# Patient Record
Sex: Male | Born: 1950 | Race: White | Hispanic: No | Marital: Married | Smoking: Never smoker
Health system: Southern US, Community
[De-identification: ages and names within clinical notes are randomized; demographics above are authoritative.]

## PROBLEM LIST (undated history)

## (undated) DIAGNOSIS — N400 Enlarged prostate without lower urinary tract symptoms: Secondary | ICD-10-CM

## (undated) DIAGNOSIS — I10 Essential (primary) hypertension: Secondary | ICD-10-CM

## (undated) DIAGNOSIS — R12 Heartburn: Secondary | ICD-10-CM

## (undated) DIAGNOSIS — E119 Type 2 diabetes mellitus without complications: Secondary | ICD-10-CM

## (undated) DIAGNOSIS — N200 Calculus of kidney: Secondary | ICD-10-CM

## (undated) DIAGNOSIS — T753XXA Motion sickness, initial encounter: Secondary | ICD-10-CM

## (undated) DIAGNOSIS — E785 Hyperlipidemia, unspecified: Secondary | ICD-10-CM

## (undated) DIAGNOSIS — C801 Malignant (primary) neoplasm, unspecified: Secondary | ICD-10-CM

## (undated) DIAGNOSIS — I639 Cerebral infarction, unspecified: Secondary | ICD-10-CM

## (undated) HISTORY — DX: Heartburn: R12

## (undated) HISTORY — DX: Cerebral infarction, unspecified: I63.9

## (undated) HISTORY — DX: Calculus of kidney: N20.0

## (undated) HISTORY — PX: UMBILICAL HERNIA REPAIR: SHX196

---

## 2004-09-10 ENCOUNTER — Ambulatory Visit: Payer: Self-pay | Admitting: Gastroenterology

## 2013-12-26 ENCOUNTER — Emergency Department: Payer: Self-pay | Admitting: Emergency Medicine

## 2014-10-14 ENCOUNTER — Ambulatory Visit: Payer: BLUE CROSS/BLUE SHIELD | Admitting: Anesthesiology

## 2014-10-14 ENCOUNTER — Ambulatory Visit
Admission: RE | Admit: 2014-10-14 | Discharge: 2014-10-14 | Disposition: A | Discharge status: Home / Self Care | Payer: BLUE CROSS/BLUE SHIELD | Source: Ambulatory Visit | Attending: Gastroenterology | Admitting: Gastroenterology

## 2014-10-14 ENCOUNTER — Encounter: Payer: Self-pay | Admitting: *Deleted

## 2014-10-14 ENCOUNTER — Encounter
Admission: RE | Disposition: A | Discharge status: Home / Self Care | Payer: Self-pay | Source: Ambulatory Visit | Attending: Gastroenterology

## 2014-10-14 DIAGNOSIS — Z85828 Personal history of other malignant neoplasm of skin: Secondary | ICD-10-CM | POA: Diagnosis not present

## 2014-10-14 DIAGNOSIS — E119 Type 2 diabetes mellitus without complications: Secondary | ICD-10-CM | POA: Diagnosis not present

## 2014-10-14 DIAGNOSIS — Z7982 Long term (current) use of aspirin: Secondary | ICD-10-CM | POA: Insufficient documentation

## 2014-10-14 DIAGNOSIS — Z1211 Encounter for screening for malignant neoplasm of colon: Secondary | ICD-10-CM | POA: Diagnosis not present

## 2014-10-14 DIAGNOSIS — Z79899 Other long term (current) drug therapy: Secondary | ICD-10-CM | POA: Diagnosis not present

## 2014-10-14 HISTORY — PX: COLONOSCOPY: SHX5424

## 2014-10-14 HISTORY — DX: Type 2 diabetes mellitus without complications: E11.9

## 2014-10-14 HISTORY — DX: Malignant (primary) neoplasm, unspecified: C80.1

## 2014-10-14 LAB — GLUCOSE, CAPILLARY: Glucose-Capillary: 174 mg/dL — ABNORMAL HIGH (ref 65–99)

## 2014-10-14 SURGERY — COLONOSCOPY
Anesthesia: General

## 2014-10-14 MED ORDER — MIDAZOLAM HCL 2 MG/2ML IJ SOLN
INTRAMUSCULAR | Status: DC | PRN
  Administered 2014-10-14: 2 mg via INTRAVENOUS

## 2014-10-14 MED ORDER — FENTANYL CITRATE (PF) 100 MCG/2ML IJ SOLN
INTRAMUSCULAR | Status: DC | PRN
  Administered 2014-10-14: 50 ug via INTRAVENOUS

## 2014-10-14 MED ORDER — PROPOFOL INFUSION 10 MG/ML OPTIME
INTRAVENOUS | Status: DC | PRN
  Administered 2014-10-14: 160 ug/kg/min via INTRAVENOUS

## 2014-10-14 MED ORDER — LIDOCAINE HCL (CARDIAC) 20 MG/ML IV SOLN
INTRAVENOUS | Status: DC | PRN
  Administered 2014-10-14: 60 mg via INTRAVENOUS

## 2014-10-14 MED ORDER — SODIUM CHLORIDE 0.9 % IV SOLN: INTRAVENOUS | Status: DC

## 2014-10-14 MED ORDER — FENTANYL CITRATE (PF) 100 MCG/2ML IJ SOLN: 25.0000 ug | INTRAMUSCULAR | Status: DC | PRN

## 2014-10-14 MED ORDER — ONDANSETRON HCL 4 MG/2ML IJ SOLN: 4.0000 mg | Freq: Once | INTRAMUSCULAR | Status: DC | PRN

## 2014-10-14 MED ORDER — SODIUM CHLORIDE 0.9 % IV SOLN
INTRAVENOUS | Status: DC
  Administered 2014-10-14: 1000 mL via INTRAVENOUS

## 2014-10-14 NOTE — H&P (Signed)
    Primary Care Physician:  Dion Body, MD Primary Gastroenterologist:  Dr. Candace Cruise  Pre-Procedure History & Physical: HPI:  Kristopher Christensen is a 64 y.o. male is here for colonoscopy.  Past Medical History  Diagnosis Date  . Diabetes mellitus without complication   . Cancer     Basal cell cancer of lt.lower leg    History reviewed. No pertinent past surgical history.  Prior to Admission medications   Medication Sig Start Date End Date Taking? Authorizing Provider  aspirin 81 MG tablet Take 81 mg by mouth daily.   Yes Historical Provider, MD  gemfibrozil (LOPID) 600 MG tablet Take 600 mg by mouth 2 (two) times daily before a meal.   Yes Historical Provider, MD  lisinopril (PRINIVIL,ZESTRIL) 2.5 MG tablet Take 2.5 mg by mouth daily.   Yes Historical Provider, MD  lovastatin (MEVACOR) 20 MG tablet Take 20 mg by mouth at bedtime.   Yes Historical Provider, MD  metFORMIN (GLUCOPHAGE) 1000 MG tablet Take 1,000 mg by mouth 2 (two) times daily with a meal.   Yes Historical Provider, MD  polyethylene glycol powder (GLYCOLAX/MIRALAX) powder Take 1 Container by mouth once.  08/24/14  Yes Historical Provider, MD    Allergies as of 09/20/2014  . (Not on File)    History reviewed. No pertinent family history.  History   Social History  . Marital Status: Married    Spouse Name: N/A  . Number of Children: N/A  . Years of Education: N/A   Occupational History  . Not on file.   Social History Main Topics  . Smoking status: Never Smoker   . Smokeless tobacco: Never Used  . Alcohol Use: Yes  . Drug Use: No  . Sexual Activity: Not on file   Other Topics Concern  . Not on file   Social History Narrative  . No narrative on file    Review of Systems: See HPI, otherwise negative ROS  Physical Exam: BP 148/83 mmHg  Pulse 55  Temp(Src) 97 F (36.1 C) (Tympanic)  Resp 18  Ht 5\' 10"  (1.778 m)  Wt 85.276 kg (188 lb)  BMI 26.98 kg/m2  SpO2 99% General:   Alert,  pleasant and  cooperative in NAD Head:  Normocephalic and atraumatic. Neck:  Supple; no masses or thyromegaly. Lungs:  Clear throughout to auscultation.    Heart:  Regular rate and rhythm. Abdomen:  Soft, nontender and nondistended. Normal bowel sounds, without guarding, and without rebound.   Neurologic:  Alert and  oriented x4;  grossly normal neurologically.  Impression/Plan: Kristopher Christensen is here for colonoscopy to be performed for screening.  Risks, benefits, limitations, and alternatives regarding colonoscopy have been reviewed with the patient.  Questions have been answered.  All parties agreeable.   Kristopher Christensen, Lupita Dawn, MD  10/14/2014, 8:27 AM

## 2014-10-14 NOTE — Transfer of Care (Signed)
Immediate Anesthesia Transfer of Care Note  Patient: Kristopher Christensen  Procedure(s) Performed: Procedure(s): COLONOSCOPY (N/A)  Patient Location: PACU and Endoscopy Unit  Anesthesia Type:General  Level of Consciousness: sedated  Airway & Oxygen Therapy: Patient Spontanous Breathing and Patient connected to nasal cannula oxygen  Post-op Assessment: Report given to RN, Post -op Vital signs reviewed and stable and Patient moving all extremities X 4  Post vital signs: Reviewed and stable  Last Vitals:  Filed Vitals:   10/14/14 0808  BP: 148/83  Pulse: 55  Temp: 36.1 C  Resp: 18    Complications: No apparent anesthesia complications

## 2014-10-14 NOTE — Op Note (Signed)
[facility] Gastroenterology Patient Name: Kristopher Christensen Procedure Date: 10/14/2014 8:37 AM MRN: [id number] Account #: [id number] Date of Birth: 1950/07/25 Admit Type: Inpatient Age: 64 Room: [facility] ENDO ROOM 4 Gender: Male Note Status: Finalized Procedure:         Colonoscopy Indications:       Screening for colorectal malignant neoplasm Providers:         Lupita Dawn. Candace Cruise, MD Referring MD:      Dion Body (Referring MD) Medicines:         Monitored Anesthesia Care Complications:     No immediate complications. Procedure:         Pre-Anesthesia Assessment:                    - Prior to the procedure, a History and Physical was                     performed, and patient medications, allergies and                     sensitivities were reviewed. The patient's tolerance of                     previous anesthesia was reviewed.                    - The risks and benefits of the procedure and the sedation                     options and risks were discussed with the patient. All                     questions were answered and informed consent was obtained.                    - After reviewing the risks and benefits, the patient was                     deemed in satisfactory condition to undergo the procedure.                    After obtaining informed consent, the colonoscope was                     passed under direct vision. Throughout the procedure, the                     patient's blood pressure, pulse, and oxygen saturations                     were monitored continuously. The Colonoscope was                     introduced through the anus and advanced to the the cecum,                     identified by appendiceal orifice and ileocecal valve. The                     colonoscopy was performed without difficulty. The patient                     tolerated the procedure well. The quality of the bowel  preparation was good. Findings:      The colon  (entire examined portion) appeared normal. Impression:        - The entire examined colon is normal.                    - No specimens collected. Recommendation:    - Discharge patient to home.                    - Repeat colonoscopy in 10 years for surveillance.                    - The findings and recommendations were discussed with the                     patient. Procedure Code(s): --- Professional ---                    [phone], Colonoscopy, flexible; diagnostic, including                     collection of specimen(s) by brushing or washing, when                     performed (separate procedure) Diagnosis Code(s): --- Professional ---                    Z12.11, Encounter for screening for malignant neoplasm of                     colon CPT copyright 2014 American Medical Association. All rights reserved. The codes documented in this report are preliminary and upon coder review may  be revised to meet current compliance requirements. Hulen Luster, MD 10/14/2014 8:52:36 AM This report has been signed electronically. Number of Addenda: 0 Note Initiated On: 10/14/2014 8:37 AM Scope Withdrawal Time: 0 hours 6 minutes 59 seconds  Total Procedure Duration: 0 hours 9 minutes 13 seconds       [facility]

## 2014-10-14 NOTE — Anesthesia Preprocedure Evaluation (Signed)
Anesthesia Evaluation  Patient identified by MRN, date of birth, ID band Patient awake    Reviewed: Allergy & Precautions, NPO status , Patient's Chart, lab work & pertinent test results  Airway Mallampati: II  TM Distance: >3 FB Neck ROM: Full    Dental  (+) Caps Molar caps:   Pulmonary neg pulmonary ROS,  breath sounds clear to auscultation  Pulmonary exam normal       Cardiovascular hypertension, Pt. on medications Rhythm:Regular Rate:Normal  Increased lipids   Neuro/Psych negative neurological ROS  negative psych ROS   GI/Hepatic negative GI ROS, Neg liver ROS,   Endo/Other  diabetes, Well Controlled, Type 2  Renal/GU negative Renal ROS  negative genitourinary   Musculoskeletal negative musculoskeletal ROS (+)   Abdominal Normal abdominal exam  (+)   Peds negative pediatric ROS (+)  Hematology negative hematology ROS (+)   Anesthesia Other Findings   Reproductive/Obstetrics negative OB ROS                             Anesthesia Physical Anesthesia Plan  ASA: II  Anesthesia Plan: General   Post-op Pain Management:    Induction: Intravenous  Airway Management Planned: Nasal Cannula  Additional Equipment:   Intra-op Plan:   Post-operative Plan:   Informed Consent: I have reviewed the patients History and Physical, chart, labs and discussed the procedure including the risks, benefits and alternatives for the proposed anesthesia with the patient or authorized representative who has indicated his/her understanding and acceptance.     Plan Discussed with: CRNA and Surgeon  Anesthesia Plan Comments:         Anesthesia Quick Evaluation

## 2014-10-14 NOTE — Anesthesia Postprocedure Evaluation (Signed)
  Anesthesia Post-op Note  Patient: Kristopher Christensen  Procedure(s) Performed: Procedure(s): COLONOSCOPY (N/A)  Anesthesia type:General  Patient location: PACU  Post pain: Pain level controlled  Post assessment: Post-op Vital signs reviewed, Patient's Cardiovascular Status Stable, Respiratory Function Stable, Patent Airway and No signs of Nausea or vomiting  Post vital signs: Reviewed and stable  Last Vitals:  Filed Vitals:   10/14/14 0900  BP: 117/71  Pulse: 57  Temp: 35.9 C  Resp: 17    Level of consciousness: awake, alert  and patient cooperative  Complications: No apparent anesthesia complications

## 2014-10-17 ENCOUNTER — Encounter: Payer: Self-pay | Admitting: Gastroenterology

## 2015-08-07 DIAGNOSIS — Z7189 Other specified counseling: Secondary | ICD-10-CM | POA: Insufficient documentation

## 2015-08-07 DIAGNOSIS — Z7185 Encounter for immunization safety counseling: Secondary | ICD-10-CM | POA: Insufficient documentation

## 2016-04-08 ENCOUNTER — Encounter: Payer: Self-pay | Admitting: Urology

## 2016-04-08 ENCOUNTER — Telehealth: Payer: Self-pay | Admitting: Urology

## 2016-04-08 ENCOUNTER — Ambulatory Visit (INDEPENDENT_AMBULATORY_CARE_PROVIDER_SITE_OTHER): Payer: BLUE CROSS/BLUE SHIELD | Admitting: Urology

## 2016-04-08 VITALS — BP 173/81 | HR 69 | Ht 71.0 in | Wt 189.0 lb

## 2016-04-08 DIAGNOSIS — N529 Male erectile dysfunction, unspecified: Secondary | ICD-10-CM

## 2016-04-08 DIAGNOSIS — N132 Hydronephrosis with renal and ureteral calculous obstruction: Secondary | ICD-10-CM

## 2016-04-08 DIAGNOSIS — N201 Calculus of ureter: Secondary | ICD-10-CM

## 2016-04-08 DIAGNOSIS — Z125 Encounter for screening for malignant neoplasm of prostate: Secondary | ICD-10-CM | POA: Diagnosis not present

## 2016-04-08 DIAGNOSIS — N200 Calculus of kidney: Secondary | ICD-10-CM

## 2016-04-08 DIAGNOSIS — R399 Unspecified symptoms and signs involving the genitourinary system: Secondary | ICD-10-CM

## 2016-04-08 LAB — URINALYSIS, COMPLETE
Bilirubin, UA: NEGATIVE
Leukocytes, UA: NEGATIVE
NITRITE UA: NEGATIVE
Protein, UA: NEGATIVE
RBC, UA: NEGATIVE
Specific Gravity, UA: 1.025 (ref 1.005–1.030)
Urobilinogen, Ur: 0.2 mg/dL (ref 0.2–1.0)
pH, UA: 5.5 (ref 5.0–7.5)

## 2016-04-08 LAB — MICROSCOPIC EXAMINATION
BACTERIA UA: NONE SEEN
EPITHELIAL CELLS (NON RENAL): NONE SEEN /HPF (ref 0–10)
RBC MICROSCOPIC, UA: NONE SEEN /HPF (ref 0–?)

## 2016-04-08 MED ORDER — TAMSULOSIN HCL 0.4 MG PO CAPS: 0.4000 mg | ORAL_CAPSULE | Freq: Every day | ORAL | 0 refills | Status: DC

## 2016-04-08 NOTE — Telephone Encounter (Signed)
Please call [facility] to see if we can obtain a disc with his CT images and how long it would take for [place] to receive the disc.

## 2016-04-08 NOTE — Progress Notes (Signed)
04/08/2016 12:50 PM   Kristopher Christensen [birth year] [id number]  Referring provider: Dion Body, MD [facility] [place], [place] [address]  Chief Complaint  Patient presents with  . New Patient (Initial Visit)    Kidney stone referred by Dr. Richarda Overlie    HPI: Patient is a 65 year old Caucasian male who is referred by his PCP, Dr. Netty Starring, for nephrolithiasis.  He is also complaining of LUTS and ED.   Patient states that approximately 1 month ago he started to experience right lower quadrant pain that radiated to the right groin.  The pain started in the evening and lasted until he went to bed. When he awoke in the morning the pain was no longer present.  The pain then returned to weeks later while he was in [place] attending his son's wedding. The pain again began in the right lower quadrant and radiated to the right groin. Only this time, the pain did not improve when he laid down to sleep. It became more intense and unrelenting. It was then he sought treatment at [facility] ED.    A CT scan of the abdomen and pelvis performed without contrast at that facility noted an 8 mm stone at the right UVJ resulting in right-sided hydronephrosis.  I do not have the images available to me at this time.   He was given prescription for a narcotic pain medication, Zofran and tamsulosin and discharged home.    Since his discharge from the emergency room, he has not had any further right lower quadrant pain or groin pain. He has not had any episodes of gross hematuria over the last several weeks. He does not have a prior history of nephrolithiasis. He was given a strainer and instructed to strain his urine, but he has not passed a fragment.  He is not had any fevers, chills, nausea or vomiting.  His UA today was unremarkable.  Patient has been having symptoms of urinary frequency, urgency, nocturia and intermittency for the last several years.    He has also  been suffering from erectile dysfunction for the last 2 years.  He denies any pain or curvature with his erections. He also denies any nocturnal tumescence.   PMH: Past Medical History:  Diagnosis Date  . Cancer ([place])    Basal cell cancer of lt.lower leg  . Diabetes mellitus without complication ([place])   . Heartburn   . Kidney stone     Surgical History: Past Surgical History:  Procedure Laterality Date  . COLONOSCOPY N/A 10/14/2014   Procedure: COLONOSCOPY;  Surgeon: Hulen Luster, MD;  Location: [facility] ENDOSCOPY;  Service: Gastroenterology;  Laterality: N/A;  . UMBILICAL HERNIA REPAIR      Home Medications:    Medication List       Accurate as of 04/08/16 11:59 PM. Always use your most recent med list.          aspirin 81 MG tablet Take 81 mg by mouth daily.   gemfibrozil 600 MG tablet Commonly known as:  LOPID Take 600 mg by mouth 2 (two) times daily before a meal.   glimepiride 2 MG tablet Commonly known as:  AMARYL TAKE 1 TABLET DAILY WITH BREAKFAST   glyBURIDE 1.25 MG tablet Commonly known as:  DIABETA Take by mouth.   HYDROcodone-acetaminophen 10-325 MG tablet Commonly known as:  NORCO Take by mouth.   lisinopril 2.5 MG tablet Commonly known as:  PRINIVIL,ZESTRIL Take 2.5 mg by mouth daily.  lovastatin 20 MG tablet Commonly known as:  MEVACOR Take 20 mg by mouth at bedtime.   metFORMIN 1000 MG tablet Commonly known as:  GLUCOPHAGE Take 1,000 mg by mouth 2 (two) times daily with a meal.   MULTIVITAMIN ADULT PO Take by mouth.   ondansetron 4 MG disintegrating tablet Commonly known as:  ZOFRAN-ODT Place under the tongue.   polyethylene glycol powder powder Commonly known as:  GLYCOLAX/MIRALAX Take 1 Container by mouth once.   tamsulosin 0.4 MG Caps capsule Commonly known as:  FLOMAX Take 1 capsule (0.4 mg total) by mouth daily.       Allergies: No Known Allergies  Family History: Family History  Problem Relation Age of Onset  . Prostate  cancer Neg Hx   . Kidney disease Neg Hx     Social History:  reports that he has never smoked. He has never used smokeless tobacco. He reports that he drinks alcohol. He reports that he does not use drugs.  ROS: UROLOGY Frequent Urination?: Yes Hard to postpone urination?: Yes Burning/pain with urination?: No Get up at night to urinate?: Yes Leakage of urine?: No Urine stream starts and stops?: Yes Trouble starting stream?: No Do you have to strain to urinate?: No Blood in urine?: No Urinary tract infection?: No Sexually transmitted disease?: No Injury to kidneys or bladder?: No Painful intercourse?: No Weak stream?: No Erection problems?: Yes Penile pain?: No  Gastrointestinal Nausea?: No Vomiting?: No Indigestion/heartburn?: Yes Diarrhea?: No Constipation?: No  Constitutional Fever: No Night sweats?: Yes Weight loss?: No Fatigue?: Yes  Skin Skin rash/lesions?: No Itching?: No  Eyes Blurred vision?: No Double vision?: No  Ears/Nose/Throat Sore throat?: No Sinus problems?: No  Hematologic/Lymphatic Swollen glands?: No Easy bruising?: No  Cardiovascular Leg swelling?: No Chest pain?: No  Respiratory Cough?: No Shortness of breath?: No  Endocrine Excessive thirst?: Yes  Musculoskeletal Back pain?: No Joint pain?: No  Neurological Headaches?: No Dizziness?: No  Psychologic Depression?: No Anxiety?: No  Physical Exam: BP (!) 173/81   Pulse 69   Ht '5\' 11"'  (1.803 m)   Wt 189 lb (85.7 kg)   BMI 26.36 kg/m   Constitutional: Well nourished. Alert and oriented, No acute distress. HEENT: [place] AT, moist mucus membranes. Trachea midline, no masses. Cardiovascular: No clubbing, cyanosis, or edema. Respiratory: Normal respiratory effort, no increased work of breathing. GI: Abdomen is soft, non tender, non distended, no abdominal masses. Liver and spleen not palpable.  No hernias appreciated.  Stool sample for occult testing is not indicated.     GU: No CVA tenderness.  No bladder fullness or masses.   Skin: No rashes, bruises or suspicious lesions. Lymph: No cervical or inguinal adenopathy. Neurologic: Grossly intact, no focal deficits, moving all 4 extremities. Psychiatric: Normal mood and affect.  Laboratory Data: Urinalysis Unremarkable.  See EPIC.  Pertinent Imaging: IMPRESSION:   8 mm stone at the right UVJ resulting in mild right-sided hydronephrosis.  No evidence of diverticulitis, no evidence of appendicitis.  Signed by: Harrington Challenger MD 03/12/2016 10:29 AM  Result Narrative  CT ABDOMEN PELVIS WO CONTRAST 03/12/2016 9:58 AM  AGE:89 years GENDER:Male HISTORY:Pain   COMPARISON: None.  TECHNIQUE:5 mm helical images were obtained from the lung bases through the ischia.  DOSAGE:Total exam DLP: 638.1 mGy-cm.CTDIvol (max): 12.4 mGy.    Dose reduction was performed with automated exposure control, iterative reconstruction technique and/or adjustment of the mA and/or kV for patient size.  FINDINGS:   Lung bases: Heart size is normal. No  consolidation, pericardial or pleural effusion.  Hepatobiliary: No focal hepatic lesion.No biliary ductal dilatation.Gallbladder is unremarkable.  Spleen: The spleen is normal in size without focal abnormality.  Pancreas: No focal mass or ductal dilatation.  Adrenal glands: Normal in appearance.  Kidneys: There is extensive perinephric stranding surrounding the right kidney. There is an 8 mm stone at the UVJ, at least partially within the urinary bladder best appreciated on series 601 image 106. There is resultant mild right-sided hydronephrosis and hydroureter. There are multiple bilateral renal cysts.  GI tract: No evidence of distention or wall thickening.  Pelvic organs and bladder: Unremarkable.  Peritoneum/retroperitoneum: No free air or free fluid.  Lymph nodes: No lymphadenopathy.  Vessels:  Unremarkable.  Musculoskeletal: Mild scattered degenerative changes throughout the visualized lumbar spine..     Assessment & Plan:    1. Right UVJ stone  - Symptoms consistent with a right UVJ stone  - We have requested the images from [facility] in [place]  - Patient will continue MET therapy with tamsulosin 0.4 mg daily and strain the urine  - Patient advised that the mean passage rate for stones 6-8 mm in size is 22 days.  - We discussed ESWL vs URS/LL/ureteral stent placement as possible treatment for his stone, but without the actual images, I could not recommend the most appropriate treatment at this time  - If the images cannot be received in a timely manner from [facility], we will order a RUS and KUB as patient does not want to incur the expense of another CT  - Urinalysis, Complete  - CULTURE, URINE COMPREHENSIVE  - Advised to contact our office or seek treatment in the ED if becomes febrile or pain/ vomiting are difficult control in order to arrange for emergent/urgent intervention  2. Right hydronephrosis  - RUS will be obtained one month after he has underwent definitive treatment for his right UVJ stone  3. LUTS  - Will address once he has been cleared of stone  4. Erectile dysfunction  -  Will address once he has been cleared of stone  5. PSA screening  - I did discuss that the AUA panel feels that in men age 28 to 47 years, there was sufficient certainty that the benefits of screening could outweigh the harms   - He has not had a recent PSA   - We will begin screening once he has been cleared of stone   Return for waiting on disc from [facility].    These notes generated with voice recognition software. I apologize for typographical errors.  Zara Council, [facility] [address], [place], [place] [address] [phone]

## 2016-04-10 NOTE — Telephone Encounter (Signed)
Please contact the patient to see if he wants to wait on the disc or order the RUS and KUB.

## 2016-04-10 NOTE — Telephone Encounter (Signed)
Radiology has sent the disk in the mail. It should be here in a 1 or 2.

## 2016-04-12 NOTE — Telephone Encounter (Signed)
I did speak with the patient and he wants to wait for the CT disk to get here due to money issues.   Sharyn Lull

## 2016-04-14 LAB — CULTURE, URINE COMPREHENSIVE

## 2016-04-19 NOTE — Telephone Encounter (Signed)
LMOM

## 2016-04-19 NOTE — Telephone Encounter (Signed)
I have reviewed the CT scans.  His stone is almost in the bladder on the CT.  Has he passed anything?  He will need a RUS if he has passed a stone.  If he has not passed a stone, he will need a KUB.

## 2016-04-22 NOTE — Telephone Encounter (Signed)
Patient needs a KUB.

## 2016-04-22 NOTE — Telephone Encounter (Signed)
Spoke with pt in reference to CT results. Pt stated that he has not passed a stone yet. Reinforced with pt to drink plenty of fluids and take flomax. Pt voiced understanding.

## 2016-04-22 NOTE — Telephone Encounter (Signed)
LMOM

## 2016-04-23 ENCOUNTER — Ambulatory Visit
Admission: RE | Admit: 2016-04-23 | Discharge: 2016-04-23 | Disposition: A | Discharge status: Home / Self Care | Payer: BLUE CROSS/BLUE SHIELD | Source: Ambulatory Visit | Attending: Urology | Admitting: Urology

## 2016-04-23 ENCOUNTER — Telehealth: Payer: Self-pay | Admitting: Urology

## 2016-04-23 DIAGNOSIS — Z87442 Personal history of urinary calculi: Secondary | ICD-10-CM | POA: Insufficient documentation

## 2016-04-23 DIAGNOSIS — Z09 Encounter for follow-up examination after completed treatment for conditions other than malignant neoplasm: Secondary | ICD-10-CM | POA: Diagnosis present

## 2016-04-23 DIAGNOSIS — M47816 Spondylosis without myelopathy or radiculopathy, lumbar region: Secondary | ICD-10-CM | POA: Diagnosis not present

## 2016-04-23 DIAGNOSIS — N200 Calculus of kidney: Secondary | ICD-10-CM

## 2016-04-23 NOTE — Telephone Encounter (Signed)
LMOM- needs a KUB

## 2016-04-23 NOTE — Addendum Note (Signed)
Addended by: Toniann Fail C on: 04/23/2016 11:35 AM   Modules accepted: Orders

## 2016-04-23 NOTE — Telephone Encounter (Signed)
Pt called back and I read message.  He will go for KUB today.

## 2016-04-24 ENCOUNTER — Telehealth: Payer: Self-pay

## 2016-04-24 DIAGNOSIS — N200 Calculus of kidney: Secondary | ICD-10-CM

## 2016-04-24 NOTE — Telephone Encounter (Signed)
Spoke with pt in reference to KUB results and needing an RUS. Pt voiced understanding. RUS orders placed.

## 2016-04-24 NOTE — Telephone Encounter (Signed)
-----   Message from Nori Riis, PA-C sent at 04/23/2016  4:43 PM EST ----- Please notify the patient that the stone is no longer seen on x-ray.  We will need to do a RUS to confirm the swelling in the kidney has healed.

## 2016-04-29 ENCOUNTER — Telehealth: Payer: Self-pay | Admitting: Urology

## 2016-04-29 NOTE — Telephone Encounter (Signed)
Yes. He still needs to have a renal ultrasound. The reason for this is patient's kidneys can remain swollen even after the passage of a stone. This may be due to debris, blood or extended periods of dilation.  If the kidney remains swollen and it is not treated, he could lose the kidney.

## 2016-04-29 NOTE — Telephone Encounter (Signed)
Pt brought in kidney stone to be sent off.  Just F.Y.I.

## 2016-04-29 NOTE — Telephone Encounter (Signed)
Patient has passed his stone and is bringing it in to send out to be tested. He wants to know if he still needs to have the RUS? Please advise.   Thanks,  Sharyn Lull

## 2016-05-02 ENCOUNTER — Telehealth: Payer: Self-pay

## 2016-05-02 ENCOUNTER — Ambulatory Visit
Admission: RE | Admit: 2016-05-02 | Discharge: 2016-05-02 | Disposition: A | Discharge status: Home / Self Care | Payer: BLUE CROSS/BLUE SHIELD | Source: Ambulatory Visit | Attending: Urology | Admitting: Urology

## 2016-05-02 DIAGNOSIS — Z87442 Personal history of urinary calculi: Secondary | ICD-10-CM | POA: Diagnosis present

## 2016-05-02 DIAGNOSIS — N281 Cyst of kidney, acquired: Secondary | ICD-10-CM | POA: Diagnosis not present

## 2016-05-02 DIAGNOSIS — Z09 Encounter for follow-up examination after completed treatment for conditions other than malignant neoplasm: Secondary | ICD-10-CM | POA: Insufficient documentation

## 2016-05-02 DIAGNOSIS — N200 Calculus of kidney: Secondary | ICD-10-CM

## 2016-05-02 DIAGNOSIS — N401 Enlarged prostate with lower urinary tract symptoms: Secondary | ICD-10-CM

## 2016-05-02 NOTE — Telephone Encounter (Signed)
-----   Message from Nori Riis, PA-C sent at 05/02/2016  1:11 PM EST ----- Please let the patient know that his RUS was negative.  We still need to check his PSA, prostate and discuss his ED.  He can make an appointment at his convenience.

## 2016-05-02 NOTE — Telephone Encounter (Signed)
Spoke with pt in reference to RUS results and needing an OV. Pt voiced understanding. Pt was transferred to the front to make f/u appt.

## 2016-05-06 ENCOUNTER — Other Ambulatory Visit: Payer: Self-pay | Admitting: Urology

## 2016-05-09 ENCOUNTER — Other Ambulatory Visit: Payer: BLUE CROSS/BLUE SHIELD

## 2016-05-09 DIAGNOSIS — N401 Enlarged prostate with lower urinary tract symptoms: Secondary | ICD-10-CM

## 2016-05-10 LAB — PSA: Prostate Specific Ag, Serum: 0.9 ng/mL (ref 0.0–4.0)

## 2016-05-14 ENCOUNTER — Encounter: Payer: Self-pay | Admitting: Urology

## 2016-05-14 ENCOUNTER — Ambulatory Visit (INDEPENDENT_AMBULATORY_CARE_PROVIDER_SITE_OTHER): Payer: BLUE CROSS/BLUE SHIELD | Admitting: Urology

## 2016-05-14 VITALS — BP 138/74 | HR 54 | Ht 71.0 in | Wt 192.1 lb

## 2016-05-14 DIAGNOSIS — N529 Male erectile dysfunction, unspecified: Secondary | ICD-10-CM

## 2016-05-14 DIAGNOSIS — N132 Hydronephrosis with renal and ureteral calculous obstruction: Secondary | ICD-10-CM

## 2016-05-14 DIAGNOSIS — R351 Nocturia: Secondary | ICD-10-CM | POA: Diagnosis not present

## 2016-05-14 DIAGNOSIS — N201 Calculus of ureter: Secondary | ICD-10-CM

## 2016-05-14 DIAGNOSIS — N401 Enlarged prostate with lower urinary tract symptoms: Secondary | ICD-10-CM | POA: Diagnosis not present

## 2016-05-14 DIAGNOSIS — N138 Other obstructive and reflux uropathy: Secondary | ICD-10-CM

## 2016-05-14 LAB — BLADDER SCAN AMB NON-IMAGING: Scan Result: 60

## 2016-05-14 MED ORDER — SILDENAFIL CITRATE 100 MG PO TABS: 100.0000 mg | ORAL_TABLET | Freq: Every day | ORAL | 12 refills | Status: DC | PRN

## 2016-05-14 MED ORDER — TAMSULOSIN HCL 0.4 MG PO CAPS: 0.4000 mg | ORAL_CAPSULE | Freq: Every day | ORAL | 12 refills | Status: DC

## 2016-05-14 NOTE — Progress Notes (Signed)
05/14/2016 11:47 AM   Kristopher Christensen [birth year] [id number]  Referring provider: Dion Body, MD [place] [facility] [place], [place] [address]  Chief Complaint  Patient presents with  . Follow-up    1 month ureteral stone    HPI: Patient is a 65 year old Caucasian male who has a history of nephrolithiasis, ED and BPH with LUTS who presents today for further evaluation.  History of nephrolithiasis A CT scan of the abdomen and pelvis without contrast performed at [facility] ED in [place] in 04/2016 noted an 8 mm stone at the right UVJ resulting in right-sided hydronephrosis.  He has since passed that stone.  His stone composition was 85% calcium oxalate monohydrate, 12% calcium oxalate dihydrate and 3% calcium phosphate carbonate.  Follow up RUS performed on 05/02/2016 noted bilateral simple renal cysts.  No other abnormality seen.  Erectile dysfunction His SHIM score is 14, which is mild to moderate ED.   He has also been suffering from erectile dysfunction for the last 2 years.  He denies any pain or curvature with his erections. He also denies any nocturnal tumescence.   His major complaint is semi rigid erections.  His libido is preserved.   His risk factors for ED are age, BPH, DM, HTN, HLD, alcohol abuse and blood pressure medications.   He not has tried anything in the past.       [place] Name 05/14/16 1127         SHIM: Over the last 6 months:   How do you rate your confidence that you could get and keep an erection? Very Low     When you had erections with sexual stimulation, how often were your erections hard enough for penetration (entering your partner)? Most Times (much more than half the time)     During sexual intercourse, how often were you able to maintain your erection after you had penetrated (entered) your partner? Difficult     During sexual intercourse, how difficult was it to maintain your erection to completion of intercourse?  Difficult     When you attempted sexual intercourse, how often was it satisfactory for you? Difficult       SHIM Total Score   SHIM 14        Score: 1-7 Severe ED 8-11 Moderate ED 12-16 Mild-Moderate ED 17-21 Mild ED 22-25 No ED   BPH WITH LUTS His IPSS score today is 15, which is moderate lower urinary tract symptomatology. He is mostly dissatisfied with his quality life due to his urinary symptoms.   His PVR is 60 mL.  Patient has been having symptoms of urinary frequency, urgency, nocturia x 4 and intermittency for the last several years.  He denies any dysuria, hematuria or suprapubic pain.   He also denies any recent fevers, chills, nausea or vomiting.  He does not have a family history of PCa.      IPSS    Row Name 05/14/16 1100         International Prostate Symptom Score   How often have you had the sensation of not emptying your bladder? Less than 1 in 5     How often have you had to urinate less than every two hours? More than half the time     How often have you found you stopped and started again several times when you urinated? Less than half the time     How often have  you found it difficult to postpone urination? Less than half the time     How often have you had a weak urinary stream? Less than 1 in 5 times     How often have you had to strain to start urination? Less than 1 in 5 times     How many times did you typically get up at night to urinate? 4 Times     Total IPSS Score 15       Quality of Life due to urinary symptoms   If you were to spend the rest of your life with your urinary condition just the way it is now how would you feel about that? Mostly Disatisfied        Score:  1-7 Mild 8-19 Moderate 20-35 Severe  PMH: Past Medical History:  Diagnosis Date  . Cancer ([place])    Basal cell cancer of lt.lower leg  . Diabetes mellitus without complication ([place])   . Heartburn   . Kidney stone     Surgical History: Past Surgical History:    Procedure Laterality Date  . COLONOSCOPY N/A 10/14/2014   Procedure: COLONOSCOPY;  Surgeon: Hulen Luster, MD;  Location: [facility] ENDOSCOPY;  Service: Gastroenterology;  Laterality: N/A;  . UMBILICAL HERNIA REPAIR      Home Medications:    Medication List       Accurate as of 05/14/16 11:47 AM. Always use your most recent med list.          aspirin 81 MG tablet Take 81 mg by mouth daily.   gemfibrozil 600 MG tablet Commonly known as:  LOPID Take 600 mg by mouth 2 (two) times daily before a meal.   glimepiride 2 MG tablet Commonly known as:  AMARYL TAKE 1 TABLET DAILY WITH BREAKFAST   glyBURIDE 1.25 MG tablet Commonly known as:  DIABETA Take by mouth.   HYDROcodone-acetaminophen 10-325 MG tablet Commonly known as:  NORCO Take by mouth.   lisinopril 2.5 MG tablet Commonly known as:  PRINIVIL,ZESTRIL Take 2.5 mg by mouth daily.   lovastatin 20 MG tablet Commonly known as:  MEVACOR Take 20 mg by mouth at bedtime.   metFORMIN 1000 MG tablet Commonly known as:  GLUCOPHAGE Take 1,000 mg by mouth 2 (two) times daily with a meal.   MULTIVITAMIN ADULT PO Take by mouth.   ondansetron 4 MG disintegrating tablet Commonly known as:  ZOFRAN-ODT Place under the tongue.   polyethylene glycol powder powder Commonly known as:  GLYCOLAX/MIRALAX Take 1 Container by mouth once.   sildenafil 100 MG tablet Commonly known as:  VIAGRA Take 1 tablet (100 mg total) by mouth daily as needed for erectile dysfunction. Take two hours prior to intercourse on an empty stomach   tamsulosin 0.4 MG Caps capsule Commonly known as:  FLOMAX Take 1 capsule (0.4 mg total) by mouth daily.       Allergies: No Known Allergies  Family History: Family History  Problem Relation Age of Onset  . Prostate cancer Neg Hx   . Kidney disease Neg Hx   . Bladder Cancer Neg Hx     Social History:  reports that he has never smoked. He has never used smokeless tobacco. He reports that he drinks alcohol.  He reports that he does not use drugs.  ROS: UROLOGY Frequent Urination?: Yes Hard to postpone urination?: No Burning/pain with urination?: No Get up at night to urinate?: Yes Leakage of urine?: No Urine stream starts and stops?: No Trouble starting stream?:  No Do you have to strain to urinate?: No Blood in urine?: No Urinary tract infection?: No Sexually transmitted disease?: No Injury to kidneys or bladder?: No Painful intercourse?: No Weak stream?: No Erection problems?: Yes Penile pain?: No  Gastrointestinal Nausea?: No Vomiting?: No Indigestion/heartburn?: No Diarrhea?: No Constipation?: No  Constitutional Fever: No Night sweats?: No Weight loss?: No Fatigue?: No  Skin Skin rash/lesions?: No Itching?: No  Eyes Blurred vision?: No Double vision?: No  Ears/Nose/Throat Sore throat?: No Sinus problems?: No  Hematologic/Lymphatic Swollen glands?: No Easy bruising?: No  Cardiovascular Leg swelling?: No Chest pain?: No  Respiratory Cough?: No Shortness of breath?: No  Endocrine Excessive thirst?: No  Musculoskeletal Back pain?: No Joint pain?: No  Neurological Headaches?: No Dizziness?: No  Psychologic Depression?: No Anxiety?: No  Physical Exam: BP 138/74   Pulse (!) 54   Ht 5\' 11"  (1.803 m)   Wt 192 lb 1.6 oz (87.1 kg)   BMI 26.79 kg/m   Constitutional: Well nourished. Alert and oriented, No acute distress. HEENT: [place] AT, moist mucus membranes. Trachea midline, no masses. Cardiovascular: No clubbing, cyanosis, or edema. Respiratory: Normal respiratory effort, no increased work of breathing. GI: Abdomen is soft, non tender, non distended, no abdominal masses. Liver and spleen not palpable.  No hernias appreciated.  Stool sample for occult testing is not indicated.   GU: No CVA tenderness.  No bladder fullness or masses.  Patient with circumcised phallus.  Urethral meatus is patent.  No penile discharge. No penile lesions or rashes.  Scrotum without lesions, cysts, rashes and/or edema.  Testicles are located scrotally bilaterally. No masses are appreciated in the testicles. Left and right epididymis are normal. Rectal: Patient with  normal sphincter tone. Anus and perineum without scarring or rashes. No rectal masses are appreciated. Prostate is approximately 45 grams, no nodules are appreciated. Seminal vesicles are normal. Skin: No rashes, bruises or suspicious lesions. Lymph: No cervical or inguinal adenopathy. Neurologic: Grossly intact, no focal deficits, moving all 4 extremities. Psychiatric: Normal mood and affect.  Laboratory Data: PSA history  0.9 ng/mL on 05/09/2016  Assessment & Plan:    1. Right UVJ stone  - resolved  - encouraged patient to drink 2.5 L of water daily, reduce salt and protein in his diet  2. Right hydronephrosis  - resolved  3. Erectile dysfunction  - SHIM score is 14  - I explained to the patient that in order to achieve an erection it takes good functioning of the nervous system (parasympathetic, sympathetic, sensory and motor), good blood flow into the erectile tissue of the penis and a desire to have sex  - I explained that conditions like diabetes, hypertension, coronary artery disease, peripheral vascular disease, smoking, alcohol consumption, age, sleep apnea and BPH can diminish the ability to have an erection  - We discussed trying PDE5 inhibitor  - He would like to try Viagra, He is warned not to take the Viagra with medications that contain nitrates.  I also advised him of the side effects, such as: headache, flushing, dyspepsia, abnormal vision, nasal congestion, back pain, myalgia, nausea, dizziness, and rash.  - RTC in one month for repeat SHIM score   4. BPH with LUTS  - IPSS score is 15/4  - Continue conservative management, avoiding bladder irritants and timed voiding's  - Initiate alpha-blocker (tamsulosin 0.4 mg daily), discussed side effects  - RTC in one month for  IPSS   5. Nocturia  - I explained to the patient that  nocturia is often multi-factorial and difficult to treat.  Sleeping disorders, heart conditions, peripheral vascular disease, diabetes, an enlarged prostate for men, an urethral stricture causing bladder outlet obstruction and/or certain medications can contribute to nocturia.  - I have suggested that the patient avoid caffeine after noon and alcohol in the evening.  He or she may also benefit from fluid restrictions after 6:00 in the evening and voiding just prior to bedtime.  - I have explained that research studies have showed that over 84% of patients with sleep apnea reported frequent nighttime urination.   With sleep apnea, oxygen decreases, carbon dioxide increases, the blood become more acidic, the heart rate drops and blood vessels in the lung constrict.  The body is then alerted that something is very wrong. The sleeper must wake enough to reopen the airway. By this time, the heart is racing and experiences a false signal of fluid overload. The heart excretes a hormone-like protein that tells the body to get rid of sodium and water, resulting in nocturia.  -  I also informed the patient that a recent study noted that decreasing sodium intake to 2.3 grams daily, if they don't have issues with hyponatremia, can also reduce the number of nightly voids  - The patient may benefit from a discussion with his or her primary care physician to see if he or she has risk factors for sleep apnea or other sleep disturbances and obtaining a sleep study.   Return in about 1 month (around 06/14/2016) for IPSS and SHIM.    These notes generated with voice recognition software. I apologize for typographical errors.  Zara Council, [facility] [address], [place], [place] [address] [phone]

## 2016-06-17 ENCOUNTER — Ambulatory Visit: Payer: BLUE CROSS/BLUE SHIELD | Admitting: Urology

## 2016-06-19 ENCOUNTER — Ambulatory Visit: Payer: BLUE CROSS/BLUE SHIELD | Admitting: Urology

## 2016-07-03 ENCOUNTER — Encounter: Payer: Self-pay | Admitting: Urology

## 2016-07-03 ENCOUNTER — Ambulatory Visit (INDEPENDENT_AMBULATORY_CARE_PROVIDER_SITE_OTHER): Payer: BLUE CROSS/BLUE SHIELD | Admitting: Urology

## 2016-07-03 VITALS — BP 186/79 | HR 61 | Ht 71.0 in | Wt 195.8 lb

## 2016-07-03 DIAGNOSIS — N529 Male erectile dysfunction, unspecified: Secondary | ICD-10-CM

## 2016-07-03 DIAGNOSIS — N401 Enlarged prostate with lower urinary tract symptoms: Secondary | ICD-10-CM | POA: Diagnosis not present

## 2016-07-03 DIAGNOSIS — N400 Enlarged prostate without lower urinary tract symptoms: Secondary | ICD-10-CM

## 2016-07-03 DIAGNOSIS — N138 Other obstructive and reflux uropathy: Secondary | ICD-10-CM | POA: Diagnosis not present

## 2016-07-03 DIAGNOSIS — R351 Nocturia: Secondary | ICD-10-CM

## 2016-07-03 LAB — BLADDER SCAN AMB NON-IMAGING: SCAN RESULT: 21

## 2016-07-03 NOTE — Progress Notes (Signed)
07/03/2016 10:21 AM   Kristopher Christensen [birth year] [id number]  Referring provider: Dion Body, MD [place] [place], [place] [address]  Chief Complaint  Patient presents with  . Benign Prostatic Hypertrophy    1 month follow up  . Nocturia    HPI: Patient is a 66 year old Caucasian male who has a history of nephrolithiasis, ED, nocturia and BPH with LUTS who presents today for a month follow up.  History of nephrolithiasis A CT scan of the abdomen and pelvis without contrast performed at [facility] ED in [place] in 04/2016 noted an 8 mm stone at the right UVJ resulting in right-sided hydronephrosis.  He has since passed that stone.  His stone composition was 85% calcium oxalate monohydrate, 12% calcium oxalate dihydrate and 3% calcium phosphate carbonate.  Follow up RUS performed on 05/02/2016 noted bilateral simple renal cysts.  No other abnormality seen.  Erectile dysfunction His SHIM score is 16, which is mild to moderate ED.   His previous SHIM score was 14.  He has also been suffering from erectile dysfunction for the last 2 years.  He denies any pain or curvature with his erections. He also denies any nocturnal tumescence.   His major complaint is semi rigid erections.  His libido is preserved.   His risk factors for ED are age, BPH, DM, HTN, HLD, alcohol abuse and blood pressure medications.   He was given a trial of Viagra at his visit last month.       SHIM    Row Name 05/14/16 1127 07/03/16 1014       SHIM: Over the last 6 months:   How do you rate your confidence that you could get and keep an erection? Very Low Low    When you had erections with sexual stimulation, how often were your erections hard enough for penetration (entering your partner)? Most Times (much more than half the time) Sometimes (about half the time)    During sexual intercourse, how often were you able to maintain your erection after you had penetrated (entered) your  partner? Difficult Difficult    During sexual intercourse, how difficult was it to maintain your erection to completion of intercourse? Difficult Slightly Difficult    When you attempted sexual intercourse, how often was it satisfactory for you? Difficult Slightly Difficult      SHIM Total Score   SHIM 14 16       Score: 1-7 Severe ED 8-11 Moderate ED 12-16 Mild-Moderate ED 17-21 Mild ED 22-25 No ED   BPH WITH LUTS His IPSS score today is 14 , which is moderate lower urinary tract symptomatology. He is mostly dissatisfied with his quality life due to his urinary symptoms.   His PVR is 21 mL.  His previous I PSS score was 15/4.  His previous PVR was 60  mL.  Patient complains of  nocturia x 4.  He denies any dysuria, hematuria or suprapubic pain.   He also denies any recent fevers, chills, nausea or vomiting.  He does not have a family history of PCa.  He was started on tamsulosin 0.4 mg daily at his visit one month ago.        IPSS    Row Name 05/14/16 1100 07/03/16 1000       International Prostate Symptom Score   How often have you had the sensation of not emptying your bladder? Less than 1 in 5 Less than half the time  How often have you had to urinate less than every two hours? More than half the time About half the time    How often have you found you stopped and started again several times when you urinated? Less than half the time Less than half the time    How often have you found it difficult to postpone urination? Less than half the time Less than 1 in 5 times    How often have you had a weak urinary stream? Less than 1 in 5 times Less than 1 in 5 times    How often have you had to strain to start urination? Less than 1 in 5 times Less than 1 in 5 times    How many times did you typically get up at night to urinate? 4 Times 4 Times    Total IPSS Score 15 14      Quality of Life due to urinary symptoms   If you were to spend the rest of your life with your urinary  condition just the way it is now how would you feel about that? Mostly Disatisfied Mostly Disatisfied       Score:  1-7 Mild 8-19 Moderate 20-35 Severe  Nocturia Patient was advised to discuss his risk factors for sleep apnea with his PCP at his visit one month ago.     PMH: Past Medical History:  Diagnosis Date  . Cancer ([place])    Basal cell cancer of lt.lower leg  . Diabetes mellitus without complication ([place])   . Heartburn   . Kidney stone     Surgical History: Past Surgical History:  Procedure Laterality Date  . COLONOSCOPY N/A 10/14/2014   Procedure: COLONOSCOPY;  Surgeon: Hulen Luster, MD;  Location: [facility] ENDOSCOPY;  Service: Gastroenterology;  Laterality: N/A;  . UMBILICAL HERNIA REPAIR      Home Medications:  Allergies as of 07/03/2016   No Known Allergies     Medication List       Accurate as of 07/03/16 10:21 AM. Always use your most recent med list.          aspirin 81 MG tablet Take 81 mg by mouth daily.   gemfibrozil 600 MG tablet Commonly known as:  LOPID Take 600 mg by mouth 2 (two) times daily before a meal.   glimepiride 2 MG tablet Commonly known as:  AMARYL TAKE 1 TABLET DAILY WITH BREAKFAST   glyBURIDE 1.25 MG tablet Commonly known as:  DIABETA Take by mouth.   HYDROcodone-acetaminophen 10-325 MG tablet Commonly known as:  NORCO Take by mouth.   lisinopril 2.5 MG tablet Commonly known as:  PRINIVIL,ZESTRIL Take 2.5 mg by mouth daily.   lovastatin 20 MG tablet Commonly known as:  MEVACOR Take 20 mg by mouth at bedtime.   metFORMIN 1000 MG tablet Commonly known as:  GLUCOPHAGE Take 1,000 mg by mouth 2 (two) times daily with a meal. Patient states 1000 mg in the am and 1500 mg in the pm   MULTIVITAMIN ADULT PO Take by mouth.   ondansetron 4 MG disintegrating tablet Commonly known as:  ZOFRAN-ODT Place under the tongue.   polyethylene glycol powder powder Commonly known as:  GLYCOLAX/MIRALAX Take 1 Container by mouth once.    sildenafil 100 MG tablet Commonly known as:  VIAGRA Take 1 tablet (100 mg total) by mouth daily as needed for erectile dysfunction. Take two hours prior to intercourse on an empty stomach   tamsulosin 0.4 MG Caps capsule Commonly known as:  FLOMAX Take 1 capsule (0.4 mg total) by mouth daily.       Allergies: No Known Allergies  Family History: Family History  Problem Relation Age of Onset  . Prostate cancer Neg Hx   . Kidney disease Neg Hx   . Bladder Cancer Neg Hx     Social History:  reports that he has never smoked. He has never used smokeless tobacco. He reports that he drinks alcohol. He reports that he does not use drugs.  ROS: UROLOGY Frequent Urination?: No Hard to postpone urination?: No Burning/pain with urination?: No Get up at night to urinate?: Yes Leakage of urine?: No Urine stream starts and stops?: No Trouble starting stream?: No Do you have to strain to urinate?: No Blood in urine?: No Urinary tract infection?: No Sexually transmitted disease?: No Injury to kidneys or bladder?: No Painful intercourse?: No Weak stream?: No Erection problems?: Yes Penile pain?: No  Gastrointestinal Nausea?: No Vomiting?: No Indigestion/heartburn?: No Diarrhea?: No Constipation?: No  Constitutional Fever: No Night sweats?: No Weight loss?: No Fatigue?: No  Skin Skin rash/lesions?: No Itching?: No  Eyes Blurred vision?: No Double vision?: No  Ears/Nose/Throat Sore throat?: No Sinus problems?: No  Hematologic/Lymphatic Swollen glands?: No Easy bruising?: No  Cardiovascular Leg swelling?: No Chest pain?: No  Respiratory Cough?: No Shortness of breath?: No  Endocrine Excessive thirst?: No  Musculoskeletal Back pain?: No Joint pain?: No  Neurological Headaches?: No Dizziness?: No  Psychologic Depression?: No Anxiety?: No  Physical Exam: BP (!) 186/79   Pulse 61   Ht 5\' 11"  (1.803 m)   Wt 195 lb 12.8 oz (88.8 kg)   BMI 27.31  kg/m   Constitutional: Well nourished. Alert and oriented, No acute distress. HEENT: [place] AT, moist mucus membranes. Trachea midline, no masses. Cardiovascular: No clubbing, cyanosis, or edema. Respiratory: Normal respiratory effort, no increased work of breathing. Skin: No rashes, bruises or suspicious lesions. Lymph: No cervical or inguinal adenopathy. Neurologic: Grossly intact, no focal deficits, moving all 4 extremities. Psychiatric: Normal mood and affect.  Laboratory Data: PSA history  0.9 ng/mL on 05/09/2016  Assessment & Plan:    1. Right UVJ stone  - resolved  - encouraged patient to drink 2.5 L of water daily, reduce salt and protein in his diet  2. Right hydronephrosis  - resolved  3. Erectile dysfunction  - SHIM score is 16, it is improved  - Continue Viagra; refills given  - RTC in one year for repeat SHIM score   4. BPH with LUTS  - IPSS score is 14/4, it is improving  - Continue conservative management, avoiding bladder irritants and timed voiding's  - Continue alpha-blocker (tamsulosin 0.4 mg daily): refills given  - RTC in one year for IPS'S, PSA and exam  5. Nocturia  - Encouraged patient to speak to his PCP regarding his risk for sleep apnea  Return in about 1 year (around 07/03/2017) for IPSS, SHIM and exam.    These notes generated with voice recognition software. I apologize for typographical errors.  Zara Council, [facility] [address] [place],  [address] [phone]

## 2016-07-05 ENCOUNTER — Other Ambulatory Visit: Payer: Self-pay

## 2016-07-05 DIAGNOSIS — N201 Calculus of ureter: Secondary | ICD-10-CM

## 2016-07-05 DIAGNOSIS — N529 Male erectile dysfunction, unspecified: Secondary | ICD-10-CM

## 2016-07-05 MED ORDER — TAMSULOSIN HCL 0.4 MG PO CAPS: 0.4000 mg | ORAL_CAPSULE | Freq: Every day | ORAL | 12 refills | Status: DC

## 2016-07-05 MED ORDER — SILDENAFIL CITRATE 100 MG PO TABS: 100.0000 mg | ORAL_TABLET | Freq: Every day | ORAL | 12 refills | Status: DC | PRN

## 2016-07-18 ENCOUNTER — Other Ambulatory Visit: Payer: Self-pay

## 2017-02-02 IMAGING — US US RENAL
1 series · 14 of 25 positions shown · non-contrast
Comparison: None.

CLINICAL DATA: Right-sided kidney stones.

EXAM:
RENAL / URINARY TRACT ULTRASOUND COMPLETE

[Series 1: us renal · 0.22mm/px · 14 of 63 slices shown]
[im 1/63]
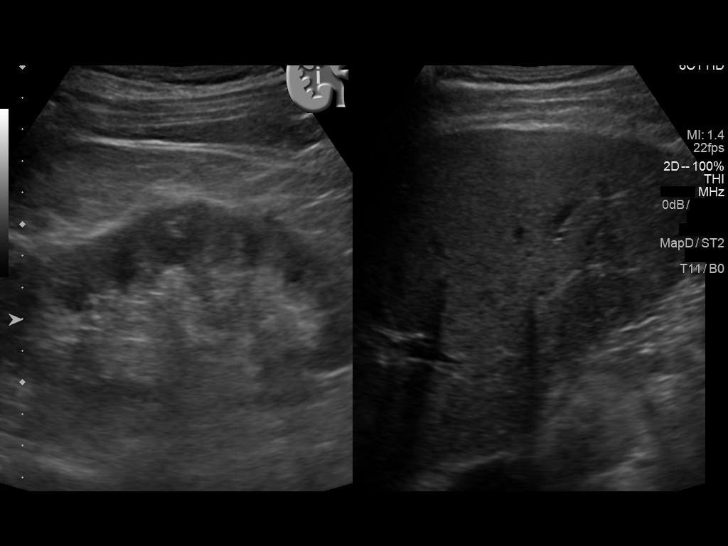
[im 6/63]
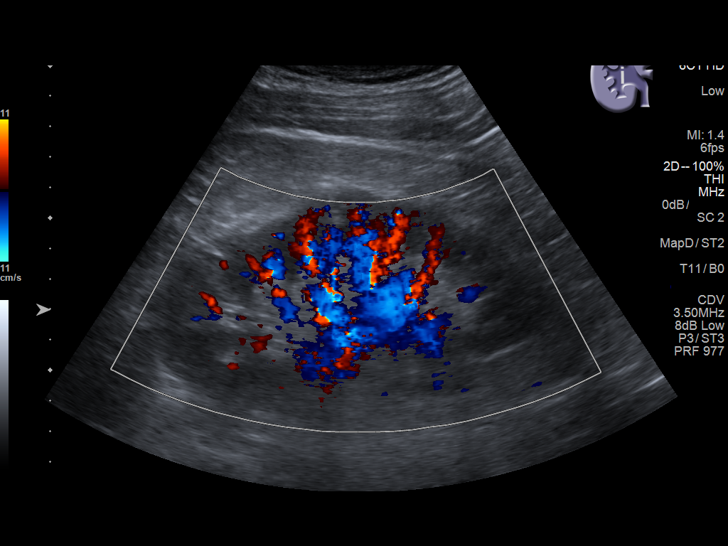
[im 11/63]
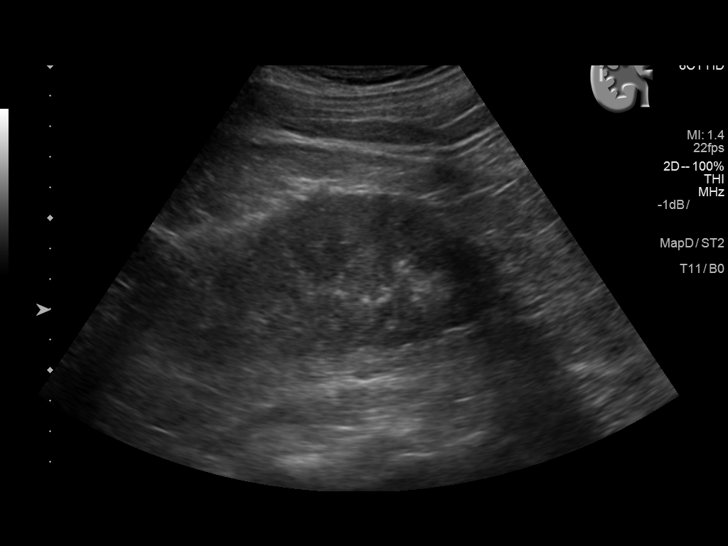
[im 16/63]
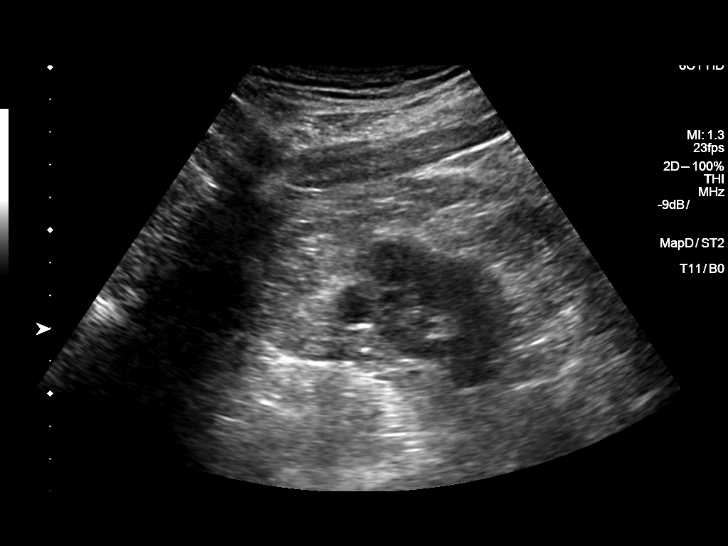
[im 21/63]
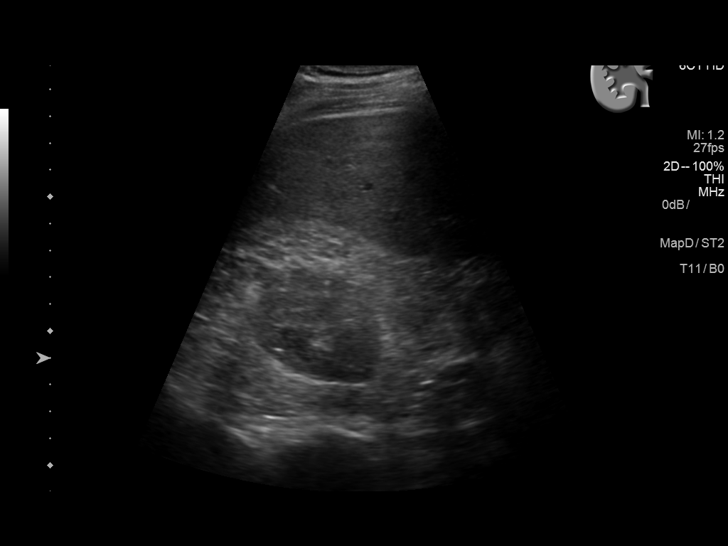
[im 24/63]
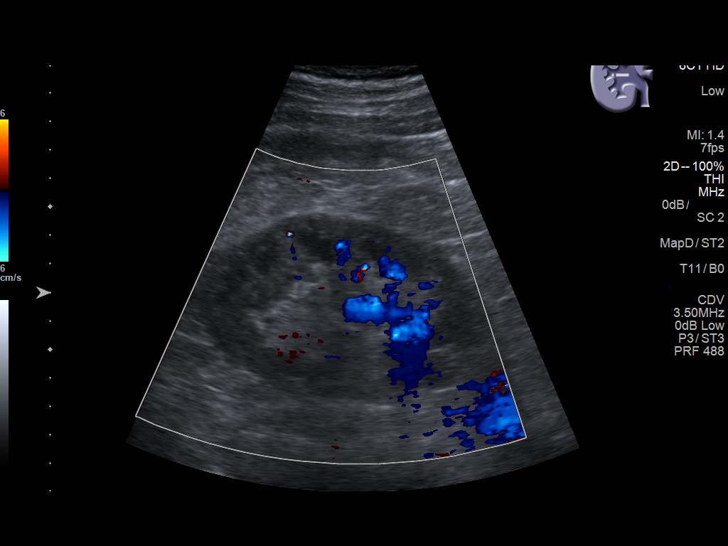
[im 29/63]
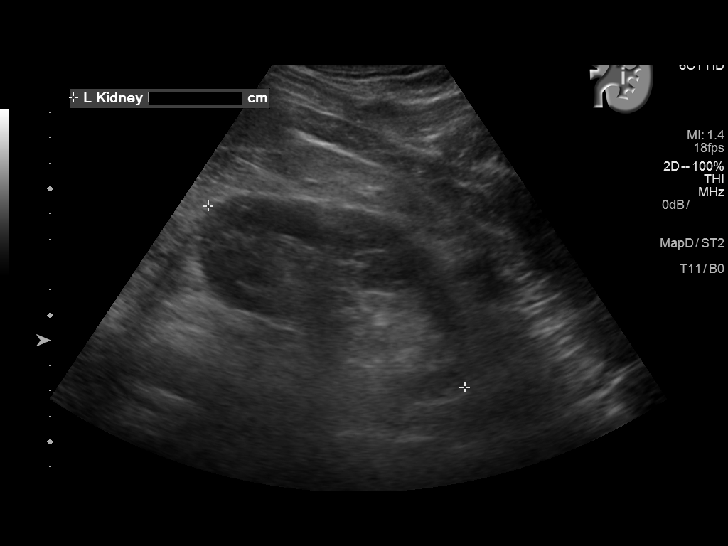
[im 34/63]
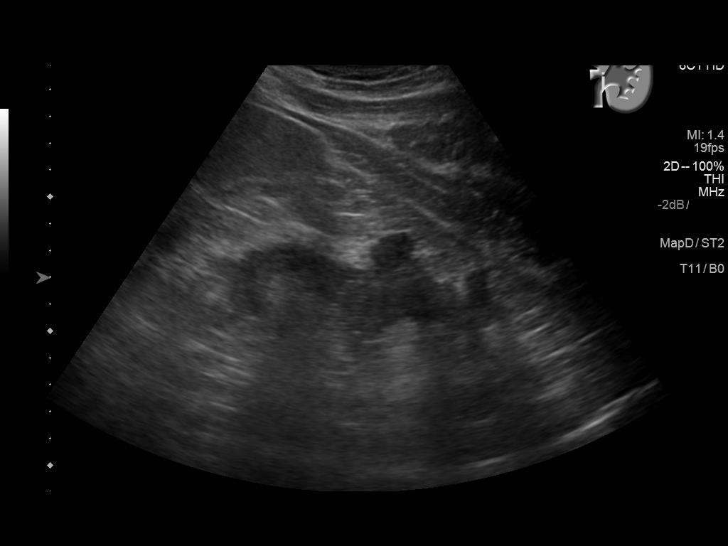
[im 39/63]
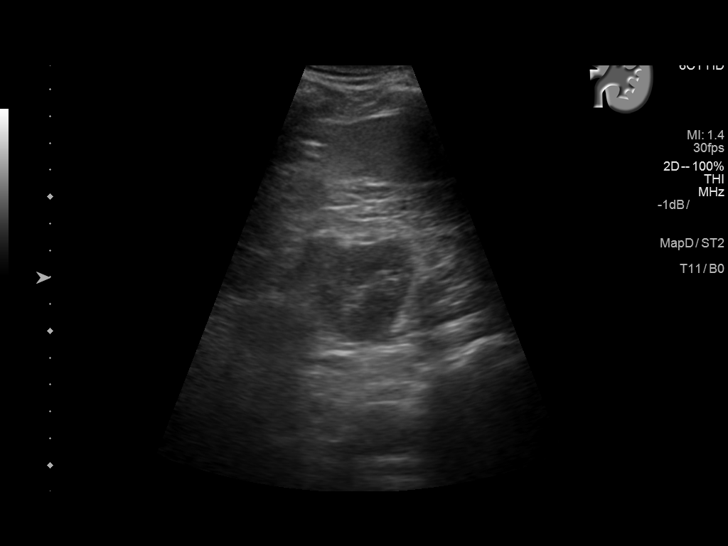
[im 42/63]
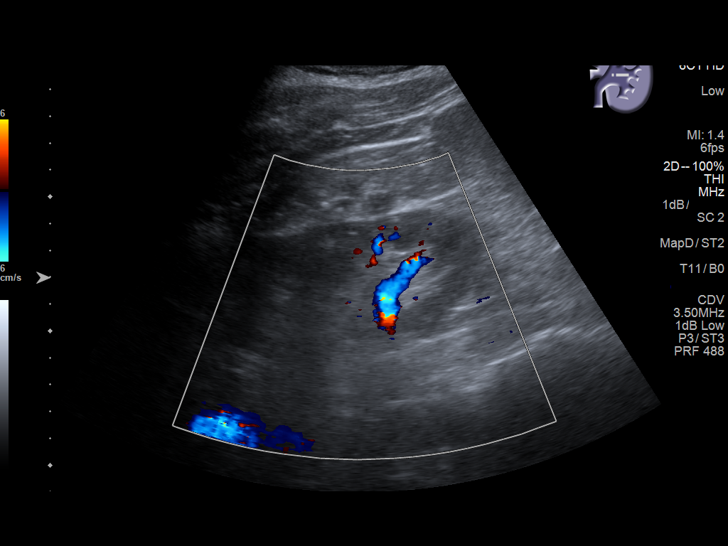
[im 47/63]
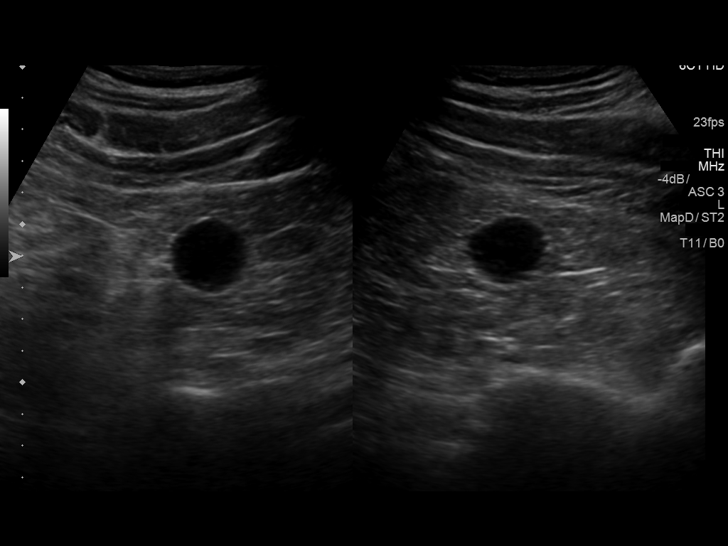
[im 52/63]
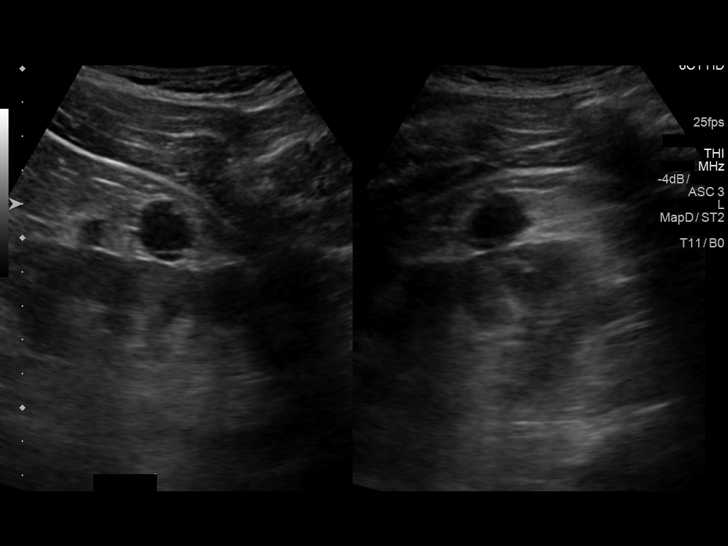
[im 57/63]
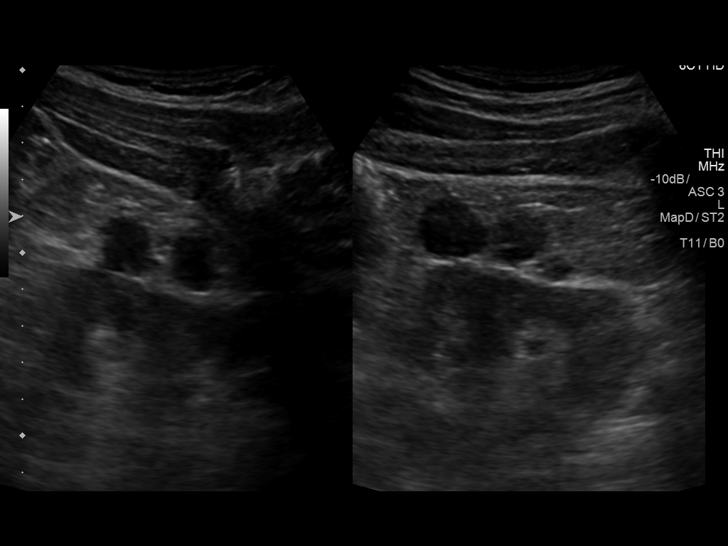
[im 63/63]
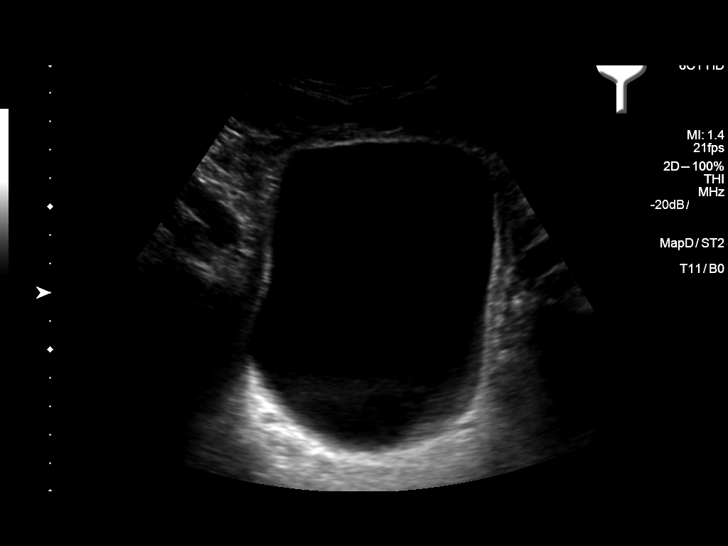

[14 of 25 positions shown; findings below may reference images not displayed]

FINDINGS: Right Kidney:

Length: 13.5 cm. 1.2 cm simple cyst is noted in lower pole.
Echogenicity within normal limits. No mass or hydronephrosis
visualized.

Left Kidney:

Length: 12.4 cm. Multiple cysts are noted, with the largest
measuring 2.5 cm in lower pole. Echogenicity within normal limits.
No mass or hydronephrosis visualized.

Bladder:

Appears normal for degree of bladder distention.
IMPRESSION: Bilateral simple renal cysts.  No other abnormality seen.

## 2017-06-26 ENCOUNTER — Other Ambulatory Visit: Payer: BLUE CROSS/BLUE SHIELD

## 2017-07-03 ENCOUNTER — Ambulatory Visit: Payer: BLUE CROSS/BLUE SHIELD | Admitting: Urology

## 2017-07-11 ENCOUNTER — Other Ambulatory Visit: Payer: BLUE CROSS/BLUE SHIELD

## 2017-07-11 DIAGNOSIS — N401 Enlarged prostate with lower urinary tract symptoms: Secondary | ICD-10-CM

## 2017-07-12 LAB — PSA: Prostate Specific Ag, Serum: 1 ng/mL (ref 0.0–4.0)

## 2017-07-14 NOTE — Progress Notes (Signed)
07/15/2017 9:27 AM   Kristopher Christensen July 11, 1950 [id number]  Referring provider: Dion Body, MD [facility] [place], [place] [address]  Chief Complaint  Patient presents with  . Follow-up    HPI: Patient is a 67 year old Caucasian male who has a history of nephrolithiasis, ED, nocturia and BPH with LUTS who presents today for a one year follow up.  History of nephrolithiasis A CT scan of the abdomen and pelvis without contrast performed at [facility] ED in [place] in 04/2016 noted an 8 mm stone at the right UVJ resulting in right-sided hydronephrosis.  He has since passed that stone.  His stone composition was 85% calcium oxalate monohydrate, 12% calcium oxalate dihydrate and 3% calcium phosphate carbonate.  Follow up RUS performed on 05/02/2016 noted bilateral simple renal cysts.  No other abnormality seen.  Erectile dysfunction His SHIM score is 16, which is mild to moderate ED.   His previous SHIM score was 16.  He has also been suffering from erectile dysfunction for the last 2 years.  He denies any pain or curvature with his erections. He also denies any nocturnal tumescence.   His major complaint is semi rigid erections.  His libido is preserved.   His risk factors for ED are age, BPH, DM, HTN, HLD, alcohol abuse and blood pressure medications.   Viagra is effective.  SHIM    Row Name 07/15/17 0900         SHIM: Over the last 6 months:   How do you rate your confidence that you could get and keep an erection?  Low     When you had erections with sexual stimulation, how often were your erections hard enough for penetration (entering your partner)?  Sometimes (about half the time)     During sexual intercourse, how often were you able to maintain your erection after you had penetrated (entered) your partner?  Sometimes (about half the time)     During sexual intercourse, how difficult was it to maintain your erection to completion of intercourse?   Slightly Difficult     When you attempted sexual intercourse, how often was it satisfactory for you?  Most Times (much more than half the time)       SHIM Total Score   SHIM  16        Score: 1-7 Severe ED 8-11 Moderate ED 12-16 Mild-Moderate ED 17-21 Mild ED 22-25 No ED   BPH WITH LUTS His IPSS score today is 12 , which is moderate lower urinary tract symptomatology. He is mixed with his quality life due to his urinary symptoms.   His PVR is 66 mL.  His previous I PSS score was 14/4.  His previous PVR was 21 mL.  Patient complains of frequency and  nocturia x 3.  He denies any dysuria, hematuria or suprapubic pain.   He also denies any recent fevers, chills, nausea or vomiting.  He does not have a family history of PCa.  He was started on tamsulosin 0.4 mg daily.     IPSS    Row Name 07/15/17 0900         International Prostate Symptom Score   How often have you had the sensation of not emptying your bladder?  Less than half the time     How often have you had to urinate less than every two hours?  Less than 1 in 5 times     How often have  you found you stopped and started again several times when you urinated?  Less than half the time     How often have you found it difficult to postpone urination?  Less than half the time     How often have you had a weak urinary stream?  Less than 1 in 5 times     How often have you had to strain to start urination?  Less than 1 in 5 times     How many times did you typically get up at night to urinate?  3 Times     Total IPSS Score  12       Quality of Life due to urinary symptoms   If you were to spend the rest of your life with your urinary condition just the way it is now how would you feel about that?  Mixed        Score:  1-7 Mild 8-19 Moderate 20-35 Severe  Nocturia Patient was advised to discuss his risk factors for sleep apnea with his PCP at his visit one month ago.     PMH: Past Medical History:  Diagnosis Date  .  Cancer ([place])    Basal cell cancer of lt.lower leg  . Diabetes mellitus without complication ([place])   . Heartburn   . Kidney stone     Surgical History: Past Surgical History:  Procedure Laterality Date  . COLONOSCOPY N/A 10/14/2014   Procedure: COLONOSCOPY;  Surgeon: Hulen Luster, MD;  Location: [facility] ENDOSCOPY;  Service: Gastroenterology;  Laterality: N/A;  . UMBILICAL HERNIA REPAIR      Home Medications:  Allergies as of 07/15/2017   No Known Allergies     Medication List        Accurate as of 07/15/17  9:27 AM. Always use your most recent med list.          aspirin 81 MG tablet Take 81 mg by mouth daily.   gemfibrozil 600 MG tablet Commonly known as:  LOPID Take 600 mg by mouth 2 (two) times daily before a meal.   glimepiride 2 MG tablet Commonly known as:  AMARYL TAKE 1 TABLET DAILY WITH BREAKFAST   lisinopril 2.5 MG tablet Commonly known as:  PRINIVIL,ZESTRIL Take 2.5 mg by mouth daily.   lovastatin 20 MG tablet Commonly known as:  MEVACOR Take 20 mg by mouth at bedtime.   metFORMIN 1000 MG tablet Commonly known as:  GLUCOPHAGE Take 1,000 mg by mouth 2 (two) times daily with a meal. Patient states 1000 mg in the am and 1500 mg in the pm   MULTIVITAMIN ADULT PO Take by mouth.   sildenafil 100 MG tablet Commonly known as:  VIAGRA Take 1 tablet (100 mg total) by mouth daily as needed for erectile dysfunction. Take two hours prior to intercourse on an empty stomach   tamsulosin 0.4 MG Caps capsule Commonly known as:  FLOMAX Take 1 capsule (0.4 mg total) by mouth daily.       Allergies: No Known Allergies  Family History: Family History  Problem Relation Age of Onset  . Prostate cancer Neg Hx   . Kidney disease Neg Hx   . Bladder Cancer Neg Hx     Social History:  reports that  has never smoked. he has never used smokeless tobacco. He reports that he drinks alcohol. He reports that he does not use drugs.  ROS: UROLOGY Frequent Urination?:  Yes Hard to postpone urination?: No Burning/pain with urination?: No Get  up at night to urinate?: Yes Leakage of urine?: No Urine stream starts and stops?: No Trouble starting stream?: No Do you have to strain to urinate?: No Blood in urine?: No Urinary tract infection?: No Sexually transmitted disease?: No Injury to kidneys or bladder?: No Painful intercourse?: No Weak stream?: No Erection problems?: No Penile pain?: No  Gastrointestinal Nausea?: No Vomiting?: No Indigestion/heartburn?: No Diarrhea?: No Constipation?: No  Constitutional Fever: No Night sweats?: No Weight loss?: No Fatigue?: No  Skin Skin rash/lesions?: No Itching?: No  Eyes Blurred vision?: No Double vision?: No  Ears/Nose/Throat Sore throat?: No Sinus problems?: No  Hematologic/Lymphatic Swollen glands?: No Easy bruising?: No  Cardiovascular Leg swelling?: No Chest pain?: No  Respiratory Cough?: No Shortness of breath?: No  Endocrine Excessive thirst?: No  Musculoskeletal Back pain?: No Joint pain?: No  Neurological Headaches?: No Dizziness?: No  Psychologic Depression?: No Anxiety?: No  Physical Exam: BP (!) 173/77   Pulse (!) 57   Ht 5\' 11"  (1.803 m)   Wt 198 lb (89.8 kg)   BMI 27.62 kg/m   Constitutional: Well nourished. Alert and oriented, No acute distress. HEENT: [place] AT, moist mucus membranes. Trachea midline, no masses. Cardiovascular: No clubbing, cyanosis, or edema. Respiratory: Normal respiratory effort, no increased work of breathing. GI: Abdomen is soft, non tender, non distended, no abdominal masses. Liver and spleen not palpable.  No hernias appreciated.  Stool sample for occult testing is not indicated.   GU: No CVA tenderness.  No bladder fullness or masses.  Patient with circumcised phallus.   Urethral meatus is patent.  No penile discharge. No penile lesions or rashes. Scrotum without lesions, cysts, rashes and/or edema.  Testicles are located  scrotally bilaterally. No masses are appreciated in the testicles. Left and right epididymis are normal. Rectal: Patient with  normal sphincter tone. Anus and perineum without scarring or rashes. No rectal masses are appreciated. Prostate is approximately 55 grams, no nodules are appreciated. Seminal vesicles are normal. Skin: No rashes, bruises or suspicious lesions. Lymph: No cervical or inguinal adenopathy. Neurologic: Grossly intact, no focal deficits, moving all 4 extremities. Psychiatric: Normal mood and affect.   Laboratory Data: PSA history  0.9 ng/mL on 05/09/2016 Component     Latest Ref Rng & Units 07/11/2017  Prostate Specific Ag, Serum     0.0 - 4.0 ng/mL 1.0   I have reviewed the labs.  Assessment & Plan:    1. Erectile dysfunction  - SHIM score is 16, it is stable  - Continue Viagra; refills given  - RTC in one year for repeat SHIM score   2. BPH with LUTS  - IPSS score is 12/3, it is worsening  - Continue conservative management, avoiding bladder irritants and timed voiding's  - Continue alpha-blocker (tamsulosin 0.4 mg daily): refills given  - RTC in one year for I PSS, PSA and exam  3. Nocturia  - Encouraged patient to speak to his PCP regarding his risk for sleep apnea  Return in about 1 year (around 07/15/2018) for IPSS, SHIM, PSA and exam.    These notes generated with voice recognition software. I apologize for typographical errors.  Zara Council, [facility] [address] [place], [place] [address] [phone]

## 2017-07-15 ENCOUNTER — Encounter: Payer: Self-pay | Admitting: Urology

## 2017-07-15 ENCOUNTER — Ambulatory Visit (INDEPENDENT_AMBULATORY_CARE_PROVIDER_SITE_OTHER): Payer: BLUE CROSS/BLUE SHIELD | Admitting: Urology

## 2017-07-15 VITALS — BP 173/77 | HR 57 | Ht 71.0 in | Wt 198.0 lb

## 2017-07-15 DIAGNOSIS — N138 Other obstructive and reflux uropathy: Secondary | ICD-10-CM

## 2017-07-15 DIAGNOSIS — N201 Calculus of ureter: Secondary | ICD-10-CM | POA: Diagnosis not present

## 2017-07-15 DIAGNOSIS — N401 Enlarged prostate with lower urinary tract symptoms: Secondary | ICD-10-CM | POA: Diagnosis not present

## 2017-07-15 DIAGNOSIS — N529 Male erectile dysfunction, unspecified: Secondary | ICD-10-CM | POA: Diagnosis not present

## 2017-07-15 DIAGNOSIS — R351 Nocturia: Secondary | ICD-10-CM

## 2017-07-15 LAB — BLADDER SCAN AMB NON-IMAGING: Scan Result: 66

## 2017-07-15 MED ORDER — SILDENAFIL CITRATE 100 MG PO TABS: 100.0000 mg | ORAL_TABLET | Freq: Every day | ORAL | 12 refills | Status: DC | PRN

## 2017-07-15 MED ORDER — TAMSULOSIN HCL 0.4 MG PO CAPS
0.4000 mg | ORAL_CAPSULE | Freq: Every day | ORAL | 12 refills | Status: DC
Start: 2017-07-15 — End: 2018-08-05

## 2017-09-29 ENCOUNTER — Other Ambulatory Visit: Payer: Self-pay | Admitting: Urology

## 2017-09-29 DIAGNOSIS — N529 Male erectile dysfunction, unspecified: Secondary | ICD-10-CM

## 2017-12-16 ENCOUNTER — Other Ambulatory Visit: Payer: Self-pay

## 2017-12-16 MED ORDER — SILDENAFIL CITRATE 100 MG PO TABS: 100.0000 mg | ORAL_TABLET | Freq: Every day | ORAL | 0 refills | Status: DC | PRN

## 2018-03-02 ENCOUNTER — Other Ambulatory Visit: Payer: Self-pay | Admitting: Family Medicine

## 2018-03-02 ENCOUNTER — Telehealth: Payer: Self-pay | Admitting: Urology

## 2018-03-02 MED ORDER — SILDENAFIL CITRATE 100 MG PO TABS: 100.0000 mg | ORAL_TABLET | Freq: Every day | ORAL | 0 refills | Status: DC | PRN

## 2018-03-02 NOTE — Telephone Encounter (Signed)
Medication filled, patient notified

## 2018-03-02 NOTE — Telephone Encounter (Signed)
Pt call office requesting a refill of his sildenafil (VIAGRA) 100 MG tablet to be called in the [facility] on [address]. Please advise. Thanks.

## 2018-05-18 ENCOUNTER — Telehealth: Payer: Self-pay | Admitting: Urology

## 2018-05-18 MED ORDER — SILDENAFIL CITRATE 100 MG PO TABS: 100.0000 mg | ORAL_TABLET | Freq: Every day | ORAL | 6 refills | Status: DC | PRN

## 2018-05-18 NOTE — Telephone Encounter (Signed)
Refill sent to [facility] on [address]

## 2018-05-18 NOTE — Telephone Encounter (Signed)
Patient called requesting a refill on his Sildenafil 100mg  to be sent to the [facility] on [address].

## 2018-07-07 ENCOUNTER — Other Ambulatory Visit: Payer: Self-pay

## 2018-07-07 DIAGNOSIS — N401 Enlarged prostate with lower urinary tract symptoms: Principal | ICD-10-CM

## 2018-07-07 DIAGNOSIS — N138 Other obstructive and reflux uropathy: Secondary | ICD-10-CM

## 2018-07-08 ENCOUNTER — Other Ambulatory Visit: Payer: Medicare HMO

## 2018-07-08 DIAGNOSIS — N138 Other obstructive and reflux uropathy: Secondary | ICD-10-CM

## 2018-07-08 DIAGNOSIS — N401 Enlarged prostate with lower urinary tract symptoms: Principal | ICD-10-CM

## 2018-07-09 LAB — PSA: PROSTATE SPECIFIC AG, SERUM: 1.3 ng/mL (ref 0.0–4.0)

## 2018-07-15 ENCOUNTER — Ambulatory Visit: Payer: Medicare HMO | Admitting: Urology

## 2018-07-15 ENCOUNTER — Encounter: Payer: Self-pay | Admitting: Urology

## 2018-07-15 VITALS — BP 154/76 | HR 58 | Ht 71.0 in | Wt 191.0 lb

## 2018-07-15 DIAGNOSIS — N529 Male erectile dysfunction, unspecified: Secondary | ICD-10-CM | POA: Diagnosis not present

## 2018-07-15 DIAGNOSIS — N138 Other obstructive and reflux uropathy: Secondary | ICD-10-CM

## 2018-07-15 DIAGNOSIS — R351 Nocturia: Secondary | ICD-10-CM

## 2018-07-15 DIAGNOSIS — E7800 Pure hypercholesterolemia, unspecified: Secondary | ICD-10-CM | POA: Insufficient documentation

## 2018-07-15 DIAGNOSIS — N401 Enlarged prostate with lower urinary tract symptoms: Secondary | ICD-10-CM | POA: Diagnosis not present

## 2018-07-15 LAB — BLADDER SCAN AMB NON-IMAGING

## 2018-07-15 MED ORDER — SILDENAFIL CITRATE 100 MG PO TABS: 100.0000 mg | ORAL_TABLET | Freq: Every day | ORAL | 6 refills | Status: DC | PRN

## 2018-07-15 NOTE — Progress Notes (Signed)
07/15/2017 9:39 AM   Kristopher Christensen 1950/08/30 [id number]  Referring provider: Dion Body, MD [facility] [place], [place] [address]  Chief Complaint  Patient presents with   Benign Prostatic Hypertrophy    HPI: Patient is a 68 year old Caucasian male who has a history of DM, nephrolithiasis, ED, nocturia and BPH with LUTS who presents today for a one year follow up.  History of nephrolithiasis A CT scan of the abdomen and pelvis without contrast performed at [facility] ED in [place] in 04/2016 noted an 8 mm stone at the right UVJ resulting in right-sided hydronephrosis.  He has since passed that stone.  His stone composition was 85% calcium oxalate monohydrate, 12% calcium oxalate dihydrate and 3% calcium phosphate carbonate.  Follow up RUS performed on 05/02/2016 noted bilateral simple renal cysts.  No other abnormality seen.  He denies gross hematuria and has no symptoms of stones.   Erectile dysfunction His SHIM score is 22, which is no ED.   His previous SHIM score was 16.  He has also been suffering from erectile dysfunction for the last 2 years.  He denies any pain or curvature with his erections. He also denies any nocturnal tumescence His libido is preserved.   His risk factors for ED are age, BPH, DM, HTN, HLD, alcohol abuse and blood pressure medications.  Viagra is effective.   SHIM    Row Name 07/15/18 0849         SHIM: Over the last 6 months:   How do you rate your confidence that you could get and keep an erection?  Low     When you had erections with sexual stimulation, how often were your erections hard enough for penetration (entering your partner)?  Almost Always or Always     During sexual intercourse, how often were you able to maintain your erection after you had penetrated (entered) your partner?  Almost Always or Always     During sexual intercourse, how difficult was it to maintain your erection to completion of  intercourse?  Not Difficult     When you attempted sexual intercourse, how often was it satisfactory for you?  Almost Always or Always       SHIM Total Score   SHIM  22         Score: 1-7 Severe ED 8-11 Moderate ED 12-16 Mild-Moderate ED 17-21 Mild ED 22-25 No ED  BPH WITH LUTS His IPSS score today is 12/3 , which is moderate lower urinary tract symptomatology. He is mixed with his quality life due to his urinary symptoms.   His PVR is 66 mL.  His previous I PSS score was 12.  His previous PVR was 21 mL.  Patient complains of frequency and  nocturia x 3.  He denies any dysuria, hematuria or suprapubic pain. He also denies any recent fevers, chills, nausea or vomiting.  He does not have a family history of PCa.  He discontinued use of tamsulosin 0.4 mg and stated that it was ineffective. His most recent PSA is 1.3 on 07/08/2018.   He researched Urolift and was interested in the procedure.   IPSS    Row Name 07/15/18 0800         International Prostate Symptom Score   How often have you had the sensation of not emptying your bladder?  Less than half the time     How often have you had to urinate less than  every two hours?  Less than half the time     How often have you found you stopped and started again several times when you urinated?  Less than 1 in 5 times     How often have you found it difficult to postpone urination?  Less than half the time     How often have you had a weak urinary stream?  Less than 1 in 5 times     How often have you had to strain to start urination?  Not at All     How many times did you typically get up at night to urinate?  4 Times     Total IPSS Score  12       Quality of Life due to urinary symptoms   If you were to spend the rest of your life with your urinary condition just the way it is now how would you feel about that?  Mixed       Score:  1-7 Mild 8-19 Moderate 20-35 Severe  Nocturia Patient was advised to discuss his risk factors for  sleep apnea with his PCP. He reports of nocturia 3-5x. He stops drining liquid by 6 pm and only drinks water in the morning. He denies leg swelling. He reports of snoring sometimes and has spontaneous erections.    PMH: Past Medical History:  Diagnosis Date   Cancer ([place])    Basal cell cancer of lt.lower leg   Diabetes mellitus without complication ([facility])    Heartburn    Kidney stone     Surgical History: Past Surgical History:  Procedure Laterality Date   COLONOSCOPY N/A 10/14/2014   Procedure: COLONOSCOPY;  Surgeon: Hulen Luster, MD;  Location: [facility] ENDOSCOPY;  Service: Gastroenterology;  Laterality: N/A;   UMBILICAL HERNIA REPAIR      Home Medications:  Allergies as of 07/15/2018   No Known Allergies     Medication List       Accurate as of July 15, 2018  9:39 AM. Always use your most recent med list.        aspirin 81 MG tablet Take 81 mg by mouth daily.   gemfibrozil 600 MG tablet Commonly known as:  LOPID Take 600 mg by mouth 2 (two) times daily before a meal.   glimepiride 2 MG tablet Commonly known as:  AMARYL TAKE 1 TABLET DAILY WITH BREAKFAST   lisinopril 2.5 MG tablet Commonly known as:  PRINIVIL,ZESTRIL Take 2.5 mg by mouth daily.   lovastatin 20 MG tablet Commonly known as:  MEVACOR Take 20 mg by mouth at bedtime.   metFORMIN 1000 MG tablet Commonly known as:  GLUCOPHAGE Take 1,000 mg by mouth 2 (two) times daily with a meal. Patient states 1000 mg in the am and 1500 mg in the pm   MULTIVITAMIN ADULT PO Take by mouth.   sildenafil 100 MG tablet Commonly known as:  VIAGRA Take 1 tablet (100 mg total) by mouth daily as needed for erectile dysfunction.   tamsulosin 0.4 MG Caps capsule Commonly known as:  FLOMAX Take 1 capsule (0.4 mg total) by mouth daily.       Allergies: No Known Allergies  Family History: Family History  Problem Relation Age of Onset   Prostate cancer Neg Hx    Kidney disease Neg Hx    Bladder Cancer Neg  Hx     Social History:  reports that he has never smoked. He has never used smokeless tobacco. He reports current  alcohol use. He reports that he does not use drugs.  ROS: UROLOGY Frequent Urination?: No Hard to postpone urination?: No Burning/pain with urination?: No Get up at night to urinate?: Yes Leakage of urine?: No Urine stream starts and stops?: No Trouble starting stream?: No Do you have to strain to urinate?: No Blood in urine?: No Urinary tract infection?: No Sexually transmitted disease?: No Injury to kidneys or bladder?: No Painful intercourse?: No Weak stream?: No Erection problems?: No Penile pain?: No  Gastrointestinal Nausea?: No Vomiting?: No Indigestion/heartburn?: No Diarrhea?: No Constipation?: No  Constitutional Fever: No Night sweats?: No Weight loss?: No Fatigue?: No  Skin Skin rash/lesions?: No Itching?: No  Eyes Blurred vision?: No Double vision?: No  Ears/Nose/Throat Sore throat?: No Sinus problems?: No  Hematologic/Lymphatic Swollen glands?: No Easy bruising?: No  Cardiovascular Leg swelling?: No Chest pain?: No  Respiratory Cough?: No Shortness of breath?: No  Endocrine Excessive thirst?: No  Musculoskeletal Back pain?: No Joint pain?: No  Neurological Headaches?: No Dizziness?: No  Psychologic Depression?: No Anxiety?: No  Physical Exam: BP (!) 154/76 (BP Location: Left Arm, Patient Position: Sitting)    Pulse (!) 58    Ht 5\' 11"  (1.803 m)    Wt 191 lb (86.6 kg)    BMI 26.64 kg/m   Constitutional:  Well nourished. Alert and oriented, No acute distress. HEENT: [place] AT, moist mucus membranes.  Trachea midline, no masses. Cardiovascular: No clubbing, cyanosis, or edema. Respiratory: Normal respiratory effort, no increased work of breathing. GU: No CVA tenderness.  No bladder fullness or masses.  Patient with circumcised. Urethral meatus is patent.  No penile discharge. No penile lesions or rashes. Scrotum  without lesions, cysts, rashes and/or edema.  Testicles are located scrotally bilaterally. No masses are appreciated in the testicles. Left and right epididymis are normal. Rectal: Patient with  normal sphincter tone. Anus and perineum without scarring or rashes. No rectal masses are appreciated. Prostate is approximately 55 grams, could only palpate mid-portion and apex, no nodules are appreciated.  Skin: No rashes, bruises or suspicious lesions. Neurologic: Grossly intact, no focal deficits, moving all 4 extremities. Psychiatric: Normal mood and affect.  Laboratory Data:  PSA history   Component     Latest Ref Rng & Units 05/09/2016 07/11/2017 07/08/2018  Prostate Specific Ag, Serum     0.0 - 4.0 ng/mL 0.9 1.0 1.3   I have reviewed the labs.  Assessment & Plan:    1. Erectile dysfunction  - SHIM score is 22, improved, No ED   - Continue Viagra 100 mg; refills given  - RTC in one year for repeat SHIM score   2. BPH with LUTS  - IPSS score is 22/3, it is worsening  - PSA is 1.3, stable   - PVR is 9 mL, voiding well   - Continue conservative management, avoiding bladder irritants and timed voiding's  - Discontinued Tamsulosin 0.4 mg, ineffective   - Pt researched and is interested in UroLift procedure; will schedule for cystoscopy and TRUS to evaluate appropriateness for procedure. If a good candidate, schedule for procedure with Dr. Erlene Quan in April  - RTC in one year for I PSS, PSA and exam  3. Nocturia  - Encouraged patient to speak to his PCP regarding his risk for sleep apnea  - explained that if he has untreated sleep apnea, the nocturia will persist despite any BOO procedure  Return for cystoscopy and TRUS for urolift .    These notes generated with voice  recognition software. I apologize for typographical errors.  Zara Council, PA-C  [facility] [address] [place], [place] [address] [phone]  I, [name], am  acting as a Education administrator for [employer],  [place]

## 2018-08-05 ENCOUNTER — Ambulatory Visit: Payer: Medicare HMO | Admitting: Urology

## 2018-08-05 ENCOUNTER — Encounter: Payer: Self-pay | Admitting: Urology

## 2018-08-05 VITALS — BP 175/91 | HR 89 | Ht 71.0 in | Wt 195.0 lb

## 2018-08-05 DIAGNOSIS — N401 Enlarged prostate with lower urinary tract symptoms: Secondary | ICD-10-CM

## 2018-08-05 DIAGNOSIS — N138 Other obstructive and reflux uropathy: Secondary | ICD-10-CM

## 2018-08-05 LAB — URINALYSIS, COMPLETE
Bilirubin, UA: NEGATIVE
Leukocytes, UA: NEGATIVE
Nitrite, UA: NEGATIVE
Protein, UA: NEGATIVE
RBC UA: NEGATIVE
Specific Gravity, UA: 1.025 (ref 1.005–1.030)
Urobilinogen, Ur: 0.2 mg/dL (ref 0.2–1.0)
pH, UA: 5.5 (ref 5.0–7.5)

## 2018-08-05 LAB — MICROSCOPIC EXAMINATION
Bacteria, UA: NONE SEEN
EPITHELIAL CELLS (NON RENAL): NONE SEEN /HPF (ref 0–10)
RBC, UA: NONE SEEN /hpf (ref 0–2)

## 2018-08-05 NOTE — Progress Notes (Signed)
08/05/18  Chief Complaint  Patient presents with  . Cysto    TRUS     HPI: 68 year old male with refractory urinary symptoms related to BPH who presents today to the office for cystoscopy and prostate sizing.   Please see previous notes for details.     Blood pressure (!) 175/91, pulse 89, height 5\' 11"  (1.803 m), weight 195 lb (88.5 kg). NED. A&Ox3.   No respiratory distress   Abd soft, NT, ND Normal phallus with bilateral descended testicles    Cystoscopy Procedure Note  Patient identification was confirmed, informed consent was obtained, and patient was prepped using Betadine solution.  Lidocaine jelly was administered per urethral meatus.    Preoperative abx where received prior to procedure.     Pre-Procedure: - Inspection reveals a normal caliber ureteral meatus.  Procedure: The flexible cystoscope was introduced without difficulty - No urethral strictures/lesions are present. - Enlarged prostate mild bilobar coaptation - Elevated bladder neck - Bilateral ureteral orifices identified - Bladder mucosa  reveals no ulcers, tumors, or lesions - No bladder stones - No trabeculation  Retroflexion shows medium well-circumscribed median lobe with ball-valve effect into the bladder   Post-Procedure: - Patient tolerated the procedure well   Prostate transrectal ultrasound siing   Informed consent was obtained after discussing risks/benefits of the procedure.  A time out was performed to ensure correct patient identity.   Pre-Procedure: -Transrectal probe was placed without difficulty -Transrectal Ultrasound performed revealing a 36.2 gm prostate measuring 4.8 x 4.17 x 3.46 cm (length) -Well-circumscribed median lobe with protrusion into the bladder   Assessment/ Plan:  1. BPH with urinary obstruction Cystoscopy and transrectal ultrasound show well-circumscribed median lobe protruding into the bladder creating a ball-valve effect.  His lateral lobes do not have  a particularly obstructive appearance and his overall prostate volume is low  We discussed that is not optimal candidate for UroLift given the presence of a median lobe although there is a new medication for this.  I do think however, he would benefit from resection of his median lobe which can be done quite easily with enucleation which likely can be done as an outpatient.  We will can consider addressing his bladder neck or lateral lobes as deemed necessary intraoperatively but likely the procedure can be done as outpatient with Foley catheter for 2 days postop.  We discussed the risk and benefits in detail including risk bleeding, infection, damage surrounding structures, ejaculatory dysfunction albeit low likelihood given that we will address tissue near his ejaculatory duct, irritative voiding symptoms and stress incontinence.  He understands that this may not resolve all of his urinary issues but could be helpful specially with the obstructive urinary symptoms.  He understands that we can always go back and resect more tissue or perform a UroLift once his median lobe is been addressed.  He is agreeable this plan.  Preop urine culture sent today.  Plan for holmium laser enucleation of the median lobe with consideration of trapping of his lateral lobes as deemed necessary intraoperatively.   Hollice Espy, MD

## 2018-08-05 NOTE — Addendum Note (Signed)
Addended by: Hollice Espy on: 08/05/2018 01:49 PM   Modules accepted: Orders

## 2018-08-06 ENCOUNTER — Other Ambulatory Visit: Payer: Self-pay | Admitting: Radiology

## 2018-08-06 ENCOUNTER — Telehealth: Payer: Self-pay | Admitting: Radiology

## 2018-08-06 DIAGNOSIS — N401 Enlarged prostate with lower urinary tract symptoms: Principal | ICD-10-CM

## 2018-08-06 DIAGNOSIS — N138 Other obstructive and reflux uropathy: Secondary | ICD-10-CM

## 2018-08-06 NOTE — Telephone Encounter (Signed)
Patient was given the [facility] Information form below as well as the Instructions for Pre-Admission Testing form & a map of [facility].   [address], [address] [place], [place] [address] Telephone: [phone] Fax: [phone]   Thank you for choosing [facility] for your upcoming surgery!  We are always here to assist in your urological needs.  Please read the following information with specific details for your upcoming appointments related to your surgery. Please contact Henri Guedes at [phone] Option 3 with any questions.  The Name of Your Surgery: Holmium laser enucleation of prostate with possible Transurethral resection of the prostate Your Surgery Date: 09/14/2018 Your Surgeon: Hollice Espy  Please call Same Day Surgery at [phone] between the hours of 1pm-3pm one day prior to your surgery. They will inform you of the time to arrive at Same Day Surgery which is located on the second floor of the [facility].   Please refer to the attached letter regarding instructions for Pre-Admission Testing. You will receive a call from the [place] office regarding your appointment with them.  The Pre-Admission Testing office is located at [place], on the first floor of the [facility] in [place] (office is to the right as you enter through the [employer] [facility] the [facility]). Please have all medications you are currently taking and your insurance card available.   Patient was advised to have nothing to eat or drink after midnight the night prior to surgery except that he may have only water until 2 hours before surgery with nothing to drink within 2 hours of surgery.  The patient states he currently takes Aspirin 81mg  & was informed to hold medication for 7 days prior to surgery beginning on 09/07/2018. Patient's  questions were answered and he expressed understanding of these instructions.

## 2018-08-07 LAB — CULTURE, URINE COMPREHENSIVE

## 2018-08-19 ENCOUNTER — Ambulatory Visit: Payer: Medicare HMO

## 2018-08-20 NOTE — Telephone Encounter (Signed)
Notified patient that hospital administration has made the decision to postpone surgery at this time due to a shortage of supplies. Patient will be contacted to reschedule. Questions answered. Patient expresses understanding of conversation.

## 2018-09-01 ENCOUNTER — Telehealth: Payer: Self-pay | Admitting: Urology

## 2018-09-01 DIAGNOSIS — R300 Dysuria: Secondary | ICD-10-CM

## 2018-09-01 NOTE — Telephone Encounter (Signed)
Pt with negative cx 3wks ago, please advise on urgency of seeing pt in office.

## 2018-09-01 NOTE — Telephone Encounter (Signed)
If he is having just gross hematuria this is most likely secondary to BPH and he is awaiting rescheduling of his surgery with Dr. Erlene Quan.  If he is having UTI symptoms such as burning or pain recommend a urinalysis/possible culture prior to antibiotic therapy.

## 2018-09-01 NOTE — Telephone Encounter (Signed)
Pt LMOM and states that he was urinating blood last night and feels like he has a UTI. He states it is clear today. Please advise.

## 2018-09-02 ENCOUNTER — Other Ambulatory Visit: Payer: Self-pay

## 2018-09-02 ENCOUNTER — Other Ambulatory Visit: Payer: Medicare HMO

## 2018-09-02 DIAGNOSIS — R300 Dysuria: Secondary | ICD-10-CM

## 2018-09-02 LAB — URINALYSIS, COMPLETE
Bilirubin, UA: NEGATIVE
Glucose, UA: NEGATIVE
Ketones, UA: NEGATIVE
Nitrite, UA: NEGATIVE
Protein,UA: NEGATIVE
Specific Gravity, UA: 1.01 (ref 1.005–1.030)
Urobilinogen, Ur: 0.2 mg/dL (ref 0.2–1.0)
pH, UA: 7 (ref 5.0–7.5)

## 2018-09-02 LAB — MICROSCOPIC EXAMINATION
Bacteria, UA: NONE SEEN
Epithelial Cells (non renal): NONE SEEN /hpf (ref 0–10)
RBC, Urine: NONE SEEN /hpf (ref 0–2)

## 2018-09-02 NOTE — Telephone Encounter (Signed)
Left Vm to return call 

## 2018-09-02 NOTE — Telephone Encounter (Signed)
Spoke with patient-still having dysuria-will come in for UA and culture. Lab appt made. Verbalized understanding.

## 2018-09-02 NOTE — Addendum Note (Signed)
Addended by: Verlene Mayer A on: 09/02/2018 11:38 AM   Modules accepted: Orders

## 2018-09-03 ENCOUNTER — Other Ambulatory Visit: Payer: BLUE CROSS/BLUE SHIELD

## 2018-09-04 LAB — CULTURE, URINE COMPREHENSIVE

## 2018-09-07 ENCOUNTER — Telehealth: Payer: Self-pay

## 2018-09-07 MED ORDER — CIPROFLOXACIN HCL 500 MG PO TABS: 500.0000 mg | ORAL_TABLET | Freq: Two times a day (BID) | ORAL | 0 refills | Status: AC

## 2018-09-07 NOTE — Telephone Encounter (Signed)
Left message on patients home answering machine to return call. Also, mychart message with results and notification of antibiotic was sent.

## 2018-09-07 NOTE — Telephone Encounter (Signed)
-----   Message from Nori Riis, PA-C sent at 09/04/2018  4:52 PM EDT ----- Please let Mr. Jentz know that his urine culture was positive.  We need to get him started on Cipro 500 mg, BID x 7 days.

## 2018-09-14 ENCOUNTER — Ambulatory Visit: Admit: 2018-09-14 | Payer: BLUE CROSS/BLUE SHIELD | Admitting: Urology

## 2018-09-14 SURGERY — ENUCLEATION, PROSTATE, USING LASER, WITH MORCELLATION
Anesthesia: Choice

## 2018-09-16 ENCOUNTER — Ambulatory Visit: Payer: Medicare HMO

## 2018-09-29 ENCOUNTER — Ambulatory Visit: Payer: Medicare HMO | Admitting: Urology

## 2018-10-19 ENCOUNTER — Other Ambulatory Visit: Payer: Self-pay | Admitting: Urology

## 2018-10-19 MED ORDER — SILDENAFIL CITRATE 100 MG PO TABS: 100.0000 mg | ORAL_TABLET | Freq: Every day | ORAL | 6 refills | Status: DC | PRN

## 2018-10-19 NOTE — Telephone Encounter (Signed)
Refill sent.

## 2018-10-19 NOTE — Telephone Encounter (Signed)
Patient called the office today requesting a refill of Sildenafil to be sent to the [facility] on [address].

## 2018-10-27 ENCOUNTER — Ambulatory Visit: Payer: Medicare HMO | Admitting: Urology

## 2018-11-02 ENCOUNTER — Other Ambulatory Visit: Payer: Self-pay | Admitting: Radiology

## 2018-11-02 DIAGNOSIS — N138 Other obstructive and reflux uropathy: Secondary | ICD-10-CM

## 2018-11-04 ENCOUNTER — Telehealth: Payer: Self-pay | Admitting: Radiology

## 2018-11-04 NOTE — Telephone Encounter (Signed)
LMOM to return call. Need to schedule surgery with Dr Erlene Quan.

## 2018-11-10 NOTE — Telephone Encounter (Signed)
Patient has questions for Dr Erlene Quan prior to scheduling surgery. Appointment made.

## 2018-11-12 ENCOUNTER — Telehealth (INDEPENDENT_AMBULATORY_CARE_PROVIDER_SITE_OTHER): Payer: Medicare HMO | Admitting: Urology

## 2018-11-12 ENCOUNTER — Other Ambulatory Visit: Payer: Self-pay

## 2018-11-12 DIAGNOSIS — N401 Enlarged prostate with lower urinary tract symptoms: Secondary | ICD-10-CM | POA: Diagnosis not present

## 2018-11-12 DIAGNOSIS — N529 Male erectile dysfunction, unspecified: Secondary | ICD-10-CM

## 2018-11-12 DIAGNOSIS — N138 Other obstructive and reflux uropathy: Secondary | ICD-10-CM

## 2018-11-12 NOTE — Progress Notes (Signed)
Virtual Visit via Video Note  I connected with Kristopher Christensen on 11/12/18 at 11:30 AM EDT by a video enabled telemedicine application and verified that I am speaking with the correct person using two identifiers.   Notably, he was able to see me on video and were able to hear each other.  Unfortunately, he was never able to get his video to work.  Location: Patient: home Provider: office   I discussed the limitations of evaluation and management by telemedicine and the availability of in person appointments. The patient expressed understanding and agreed to proceed.  History of Present Illness: 68 year old male with severe both irritative and obstructive voiding symptoms who presents today via virtual visit.  He was last seen 08/05/2018.  That time he underwent cystoscopy and prostate sizing.  His prostate was relatively small at 36.2 g with there is a well-circumscribed median lobe protruding into the base of the bladder.  Has had a ball-valve appearance.  He was previously on Flomax which failed to improve his symptoms.  His symptoms primarily are difficulty starting a stream, weak stream, sensation of incomplete bladder emptying and nocturia x3-4.  He also has some daytime frequency.  He is relatively bothered by his symptoms.  He was initially scheduled for holmium laser enucleation primarily of his median lobe but has some questions today.  Due to COVID-19, surgery was rescheduled.  He is concerned today about sexual function and ejaculatory function.  He was previously interested in the UroLift procedure but due to the presence of a large median lobe, we discussed resection.  He does have baseline erectile dysfunction but responds well to Viagra 100 mg.    Observations/Objective: Interactive, appropriate, asking intelligent questions  Assessment and Plan:  1. BPH with obstruction/lower urinary tract symptoms Lengthy discussion to given via virtual visit about his various treatment  options for poorly controlled symptoms  Given the presence of a large median lobe and relatively small gland, I have strongly recommended resection of his median lobe which can be done either via traditional TURP, laser enucleation or laser ablation.  Gust with her new indications for UroLift for median lobe but I personally have relatively limited expertise with this technique this would consider either second opinion or the aforementioned.  His lateral lobes do not have a particular obstructive appearance.  We discussed the risks and benefits of the above.  He is very concerned about his ejaculatory dysfunction postoperatively.  As such, I am a strongly recommend holmium laser enucleation of the median lobe only and leave the lateral lobes intact.  We discussed that if his symptoms resolve, we need no further intervention in his ejaculation should remain intact.  If his symptoms persist, we could always go back and UroLift the lateral lobes.  He is agreeable this hybrid technique plan.  Discussed the risk at length including risk of bleeding, infection, damage surrounding structures amongst others.  We discussed the risk of stress incontinence and ejaculatory function at length as well although unlikely.  All questions were answered.  He understands to be an outpatient procedure and will need catheter for approximately 2 days postop.  2. Erectile dysfunction, unspecified erectile dysfunction type Continue Viagra as needed   Follow Up Instructions: Schedule HoLEP of median lobe only   I discussed the assessment and treatment plan with the patient. The patient was provided an opportunity to ask questions and all were answered. The patient agreed with the plan and demonstrated an understanding of the instructions.  The patient was advised to call back or seek an in-person evaluation if the symptoms worsen or if the condition fails to improve as anticipated.  I provided 14 minutes of  non-face-to-face time during this encounter.   Hollice Espy, MD

## 2018-11-13 ENCOUNTER — Other Ambulatory Visit: Payer: Self-pay | Admitting: Radiology

## 2018-11-13 NOTE — Telephone Encounter (Signed)
Discussed the [facility] Information form below over the phone with patient.    [address], [place] [place], [place] [address] Telephone: [phone] Fax: [phone]   Thank you for choosing [facility] for your upcoming surgery!  We are always here to assist in your urological needs.  Please read the following information with specific details for your upcoming appointments related to your surgery. Please contact Oshay Stranahan at [phone] Option 3 with any questions.  The Name of Your Surgery: Holmium laser enucleation of prostate Your Surgery Date: 12/14/2018 Your Surgeon: Irma Newness  Please call Same Day Surgery at [phone] between the hours of 1pm-3pm one day prior to your surgery. They will inform you of the time to arrive at Same Day Surgery which is located on the second floor of the [facility].   Please refer to the attached letter regarding instructions for Pre-Admission Testing. You will receive a call from the [place] office regarding your appointment with them.  The Pre-Admission Testing office is located at [place], on the first floor of the [facility] in [place] (office is to the right as you enter through the [employer] [facility] the [facility]). Please have all medications you are currently taking and your insurance card available.  A COVID-19 test will be required prior to surgery and once test is performed you will need to remain in quarantine until the day of surgery. Patient was advised to have nothing to eat or drink after midnight the night prior to surgery except that he may have only water until 2 hours before surgery with nothing to drink within 2 hours of surgery.  The patient states he currently takes aspirin 81mg  & was informed to hold medication for 7 days prior to surgery  beginning on 12/07/2018. Patient's questions were answered and he expressed understanding of these instructions.

## 2018-12-02 ENCOUNTER — Other Ambulatory Visit: Payer: Self-pay

## 2018-12-02 DIAGNOSIS — Z01818 Encounter for other preprocedural examination: Secondary | ICD-10-CM

## 2018-12-03 ENCOUNTER — Other Ambulatory Visit: Payer: Self-pay

## 2018-12-03 ENCOUNTER — Other Ambulatory Visit: Payer: Medicare HMO

## 2018-12-03 DIAGNOSIS — Z01818 Encounter for other preprocedural examination: Secondary | ICD-10-CM

## 2018-12-03 LAB — MICROSCOPIC EXAMINATION
Bacteria, UA: NONE SEEN
RBC, Urine: NONE SEEN /hpf (ref 0–2)

## 2018-12-03 LAB — URINALYSIS, COMPLETE
Bilirubin, UA: NEGATIVE
Leukocytes,UA: NEGATIVE
Nitrite, UA: NEGATIVE
Protein,UA: NEGATIVE
RBC, UA: NEGATIVE
Specific Gravity, UA: 1.025 (ref 1.005–1.030)
Urobilinogen, Ur: 0.2 mg/dL (ref 0.2–1.0)
pH, UA: 6.5 (ref 5.0–7.5)

## 2018-12-06 LAB — CULTURE, URINE COMPREHENSIVE

## 2018-12-09 ENCOUNTER — Encounter
Admission: RE | Admit: 2018-12-09 | Discharge: 2018-12-09 | Disposition: A | Discharge status: Home / Self Care | Payer: Medicare HMO | Source: Ambulatory Visit | Attending: Urology | Admitting: Urology

## 2018-12-09 ENCOUNTER — Other Ambulatory Visit: Payer: Self-pay

## 2018-12-09 DIAGNOSIS — Z01818 Encounter for other preprocedural examination: Secondary | ICD-10-CM | POA: Insufficient documentation

## 2018-12-09 DIAGNOSIS — R001 Bradycardia, unspecified: Secondary | ICD-10-CM | POA: Insufficient documentation

## 2018-12-09 DIAGNOSIS — E119 Type 2 diabetes mellitus without complications: Secondary | ICD-10-CM | POA: Insufficient documentation

## 2018-12-09 DIAGNOSIS — Z1159 Encounter for screening for other viral diseases: Secondary | ICD-10-CM | POA: Insufficient documentation

## 2018-12-09 HISTORY — DX: Benign prostatic hyperplasia without lower urinary tract symptoms: N40.0

## 2018-12-09 HISTORY — DX: Hyperlipidemia, unspecified: E78.5

## 2018-12-09 NOTE — Patient Instructions (Signed)
Your procedure is scheduled on: 12/14/2018 Mon Report to Same Day Surgery 2nd floor medical mall [facility] Entrance-take elevator on left to 2nd floor.  Check in with surgery information desk.) To find out your arrival time please call [phone] between 1PM - 3PM on 12/11/2018 Fri  Remember: Instructions that are not followed completely may result in serious medical risk, up to and including death, or upon the discretion of your surgeon and anesthesiologist your surgery may need to be rescheduled.    _x___ 1. Do not eat food after midnight the night before your procedure. You may drink clear liquids up to 2 hours before you are scheduled to arrive at the hospital for your procedure.  Do not drink clear liquids within 2 hours of your scheduled arrival to the hospital.  Clear liquids include  --Water or Apple juice without pulp  --Clear carbohydrate beverage such as ClearFast or Gatorade  --Black Coffee or Clear Tea (No milk, no creamers, do not add anything to                  the coffee or Tea Type 1 and type 2 diabetics should only drink water.   ____Ensure clear carbohydrate drink on the way to the hospital for bariatric patients  ____Ensure clear carbohydrate drink 3 hours before surgery for Dr Dwyane Luo patients if physician instructed.   No gum chewing or hard candies.     __x__ 2. No Alcohol for 24 hours before or after surgery.   __x__3. No Smoking or e-cigarettes for 24 prior to surgery.  Do not use any chewable tobacco products for at least 6 hour prior to surgery   ____  4. Bring all medications with you on the day of surgery if instructed.    __x__ 5. Notify your doctor if there is any change in your medical condition     (cold, fever, infections).    x___6. On the morning of surgery brush your teeth with toothpaste and water.  You may rinse your mouth with mouth wash if you wish.  Do not swallow any toothpaste or mouthwash.   Do not wear jewelry, make-up, hairpins,  clips or nail polish.  Do not wear lotions, powders, or perfumes. You may wear deodorant.  Do not shave 48 hours prior to surgery. Men may shave face and neck.  Do not bring valuables to the hospital.    [facility] is not responsible for any belongings or valuables.               Contacts, dentures or bridgework may not be worn into surgery.  Leave your suitcase in the car. After surgery it may be brought to your room.  For patients admitted to the hospital, discharge time is determined by your                       treatment team.  _  Patients discharged the day of surgery will not be allowed to drive home.  You will need someone to drive you home and stay with you the night of your procedure.    Please read over the following fact sheets that you were given:   [facility] Preparing for Surgery and or MRSA Information   _x___ Take anti-hypertensive listed below, cardiac, seizure, asthma,     anti-reflux and psychiatric medicines. These include:  1. None  2.  3.  4.  5.  6.  ____Fleets enema or Magnesium Citrate as directed.  _x___ Use CHG Soap or sage wipes as directed on instruction sheet   ____ Use inhalers on the day of surgery and bring to hospital day of surgery  _x___ Stop Metformin and Janumet 2 days prior to surgery.    ____ Take 1/2 of usual insulin dose the night before surgery and none on the morning     surgery.   _x___ Follow recommendations from Cardiologist, Pulmonologist or PCP regarding          stopping Aspirin, Coumadin, Plavix ,Eliquis, Effient, or Pradaxa, and Pletal. Instructed by physician to stop aspirin last week  X____Stop Anti-inflammatories such as Advil, Aleve, Ibuprofen, Motrin, Naproxen, Naprosyn, Goodies powders or aspirin products. OK to take Tylenol and                          Celebrex.   _x___ Stop supplements until after surgery.  But may continue Vitamin D, Vitamin B,       and multivitamin.   ____ Bring C-Pap to the hospital.

## 2018-12-10 ENCOUNTER — Other Ambulatory Visit: Payer: Self-pay

## 2018-12-10 ENCOUNTER — Encounter
Admission: RE | Admit: 2018-12-10 | Discharge: 2018-12-10 | Disposition: A | Discharge status: Home / Self Care | Payer: Medicare HMO | Source: Ambulatory Visit | Attending: Urology | Admitting: Urology

## 2018-12-10 ENCOUNTER — Other Ambulatory Visit: Admission: RE | Admit: 2018-12-10 | Payer: Medicare HMO | Source: Ambulatory Visit

## 2018-12-10 DIAGNOSIS — R001 Bradycardia, unspecified: Secondary | ICD-10-CM | POA: Diagnosis not present

## 2018-12-10 DIAGNOSIS — E119 Type 2 diabetes mellitus without complications: Secondary | ICD-10-CM | POA: Diagnosis not present

## 2018-12-10 DIAGNOSIS — Z1159 Encounter for screening for other viral diseases: Secondary | ICD-10-CM | POA: Diagnosis not present

## 2018-12-10 DIAGNOSIS — Z01818 Encounter for other preprocedural examination: Secondary | ICD-10-CM | POA: Diagnosis present

## 2018-12-11 LAB — SARS CORONAVIRUS 2 (TAT 6-24 HRS): SARS Coronavirus 2: NEGATIVE

## 2018-12-13 MED ORDER — CEFAZOLIN SODIUM-DEXTROSE 2-4 GM/100ML-% IV SOLN
2.0000 g | INTRAVENOUS | Status: AC
  Administered 2018-12-14: 2 g via INTRAVENOUS

## 2018-12-14 ENCOUNTER — Ambulatory Visit
Admission: RE | Admit: 2018-12-14 | Discharge: 2018-12-14 | Disposition: A | Discharge status: Home / Self Care | Payer: Medicare HMO | Source: Ambulatory Visit | Attending: Urology | Admitting: Urology

## 2018-12-14 ENCOUNTER — Ambulatory Visit: Payer: Medicare HMO | Admitting: Anesthesiology

## 2018-12-14 ENCOUNTER — Other Ambulatory Visit: Payer: Self-pay

## 2018-12-14 ENCOUNTER — Encounter: Payer: Self-pay | Admitting: *Deleted

## 2018-12-14 ENCOUNTER — Encounter
Admission: RE | Disposition: A | Discharge status: Home / Self Care | Payer: Self-pay | Source: Ambulatory Visit | Attending: Urology

## 2018-12-14 DIAGNOSIS — N529 Male erectile dysfunction, unspecified: Secondary | ICD-10-CM | POA: Diagnosis not present

## 2018-12-14 DIAGNOSIS — Z79899 Other long term (current) drug therapy: Secondary | ICD-10-CM | POA: Insufficient documentation

## 2018-12-14 DIAGNOSIS — I10 Essential (primary) hypertension: Secondary | ICD-10-CM | POA: Insufficient documentation

## 2018-12-14 DIAGNOSIS — Z7984 Long term (current) use of oral hypoglycemic drugs: Secondary | ICD-10-CM | POA: Diagnosis not present

## 2018-12-14 DIAGNOSIS — N138 Other obstructive and reflux uropathy: Secondary | ICD-10-CM | POA: Insufficient documentation

## 2018-12-14 DIAGNOSIS — Z87442 Personal history of urinary calculi: Secondary | ICD-10-CM | POA: Insufficient documentation

## 2018-12-14 DIAGNOSIS — E119 Type 2 diabetes mellitus without complications: Secondary | ICD-10-CM | POA: Diagnosis not present

## 2018-12-14 DIAGNOSIS — N401 Enlarged prostate with lower urinary tract symptoms: Secondary | ICD-10-CM | POA: Insufficient documentation

## 2018-12-14 DIAGNOSIS — Z7982 Long term (current) use of aspirin: Secondary | ICD-10-CM | POA: Insufficient documentation

## 2018-12-14 HISTORY — PX: HOLEP-LASER ENUCLEATION OF THE PROSTATE WITH MORCELLATION: SHX6641

## 2018-12-14 LAB — GLUCOSE, CAPILLARY
Glucose-Capillary: 176 mg/dL — ABNORMAL HIGH (ref 70–99)
Glucose-Capillary: 176 mg/dL — ABNORMAL HIGH (ref 70–99)

## 2018-12-14 SURGERY — ENUCLEATION, PROSTATE, USING LASER, WITH MORCELLATION
Anesthesia: General | Site: Prostate

## 2018-12-14 MED ORDER — FENTANYL CITRATE (PF) 100 MCG/2ML IJ SOLN
INTRAMUSCULAR | Status: AC
  Filled 2018-12-14: qty 2

## 2018-12-14 MED ORDER — OXYBUTYNIN CHLORIDE 5 MG PO TABS
5.0000 mg | ORAL_TABLET | Freq: Once | ORAL | Status: DC
  Filled 2018-12-14: qty 1

## 2018-12-14 MED ORDER — SUGAMMADEX SODIUM 200 MG/2ML IV SOLN
INTRAVENOUS | Status: DC | PRN
  Administered 2018-12-14: 200 mg via INTRAVENOUS

## 2018-12-14 MED ORDER — DEXAMETHASONE SODIUM PHOSPHATE 10 MG/ML IJ SOLN
INTRAMUSCULAR | Status: DC | PRN
  Administered 2018-12-14: 10 mg via INTRAVENOUS

## 2018-12-14 MED ORDER — ROCURONIUM BROMIDE 100 MG/10ML IV SOLN
INTRAVENOUS | Status: DC | PRN
  Administered 2018-12-14: 40 mg via INTRAVENOUS

## 2018-12-14 MED ORDER — HYDROCODONE-ACETAMINOPHEN 5-325 MG PO TABS: 1.0000 | ORAL_TABLET | Freq: Once | ORAL | Status: DC

## 2018-12-14 MED ORDER — MIDAZOLAM HCL 2 MG/2ML IJ SOLN
INTRAMUSCULAR | Status: DC | PRN
  Administered 2018-12-14: 2 mg via INTRAVENOUS

## 2018-12-14 MED ORDER — FAMOTIDINE 20 MG PO TABS
20.0000 mg | ORAL_TABLET | Freq: Once | ORAL | Status: AC
  Administered 2018-12-14: 20 mg via ORAL

## 2018-12-14 MED ORDER — HYDRALAZINE HCL 20 MG/ML IJ SOLN
INTRAMUSCULAR | Status: AC
  Filled 2018-12-14: qty 1

## 2018-12-14 MED ORDER — LACTATED RINGERS IV SOLN
INTRAVENOUS | Status: DC | PRN
  Administered 2018-12-14: 10:00:00 via INTRAVENOUS

## 2018-12-14 MED ORDER — FUROSEMIDE 10 MG/ML IJ SOLN
INTRAMUSCULAR | Status: DC | PRN
  Administered 2018-12-14: 10 mg via INTRAMUSCULAR

## 2018-12-14 MED ORDER — FAMOTIDINE 20 MG PO TABS
ORAL_TABLET | ORAL | Status: AC
  Administered 2018-12-14: 20 mg via ORAL
  Filled 2018-12-14: qty 1

## 2018-12-14 MED ORDER — CEFAZOLIN SODIUM-DEXTROSE 2-4 GM/100ML-% IV SOLN
INTRAVENOUS | Status: AC
  Filled 2018-12-14: qty 100

## 2018-12-14 MED ORDER — HYDROCODONE-ACETAMINOPHEN 5-325 MG PO TABS: 1.0000 | ORAL_TABLET | Freq: Four times a day (QID) | ORAL | 0 refills | Status: DC | PRN

## 2018-12-14 MED ORDER — SODIUM CHLORIDE 0.9 % IV SOLN
INTRAVENOUS | Status: DC
  Administered 2018-12-14: 09:00:00 via INTRAVENOUS

## 2018-12-14 MED ORDER — FENTANYL CITRATE (PF) 100 MCG/2ML IJ SOLN
25.0000 ug | INTRAMUSCULAR | Status: DC | PRN
  Administered 2018-12-14 (×2): 25 ug via INTRAVENOUS

## 2018-12-14 MED ORDER — LIDOCAINE HCL (CARDIAC) PF 100 MG/5ML IV SOSY
PREFILLED_SYRINGE | INTRAVENOUS | Status: DC | PRN
  Administered 2018-12-14: 50 mg via INTRAVENOUS

## 2018-12-14 MED ORDER — OXYBUTYNIN CHLORIDE 5 MG PO TABS: 5.0000 mg | ORAL_TABLET | Freq: Three times a day (TID) | ORAL | 0 refills | Status: DC | PRN

## 2018-12-14 MED ORDER — PROPOFOL 10 MG/ML IV BOLUS
INTRAVENOUS | Status: DC | PRN
  Administered 2018-12-14: 150 mg via INTRAVENOUS

## 2018-12-14 MED ORDER — ONDANSETRON HCL 4 MG/2ML IJ SOLN
INTRAMUSCULAR | Status: DC | PRN
  Administered 2018-12-14: 4 mg via INTRAVENOUS

## 2018-12-14 MED ORDER — FENTANYL CITRATE (PF) 100 MCG/2ML IJ SOLN
INTRAMUSCULAR | Status: DC | PRN
  Administered 2018-12-14 (×2): 50 ug via INTRAVENOUS

## 2018-12-14 MED ORDER — MIDAZOLAM HCL 2 MG/2ML IJ SOLN
INTRAMUSCULAR | Status: AC
  Filled 2018-12-14: qty 2

## 2018-12-14 MED ORDER — ONDANSETRON HCL 4 MG/2ML IJ SOLN: 4.0000 mg | Freq: Once | INTRAMUSCULAR | Status: DC | PRN

## 2018-12-14 MED ORDER — HYDRALAZINE HCL 20 MG/ML IJ SOLN: 10.0000 mg | Freq: Once | INTRAMUSCULAR | Status: DC

## 2018-12-14 MED ORDER — HYDROCODONE-ACETAMINOPHEN 5-325 MG PO TABS
ORAL_TABLET | ORAL | Status: AC
  Administered 2018-12-14: 1
  Filled 2018-12-14: qty 1

## 2018-12-14 MED ORDER — OXYBUTYNIN CHLORIDE 5 MG PO TABS
ORAL_TABLET | ORAL | Status: AC
  Administered 2018-12-14: 5 mg
  Filled 2018-12-14: qty 1

## 2018-12-14 SURGICAL SUPPLY — 31 items
ADAPTER IRRIG TUBE 2 SPIKE SOL (ADAPTER) ×4 IMPLANT
BAG URINE DRAINAGE (UROLOGICAL SUPPLIES) ×2 IMPLANT
BAG URO DRAIN 4000ML (MISCELLANEOUS) IMPLANT
CATH FOL 2WAY LX 20X30 (CATHETERS) ×2 IMPLANT
CATH FOL 2WAY LX 22X30 (CATHETERS) IMPLANT
CATH FOLEY 3WAY 30CC 22FR (CATHETERS) IMPLANT
CATH URETL 5X70 OPEN END (CATHETERS) ×2 IMPLANT
CONTAINER COLLECT MORCELLATR (MISCELLANEOUS) ×1 IMPLANT
COVER WAND RF STERILE (DRAPES) IMPLANT
DRAPE SHEET LG 3/4 BI-LAMINATE (DRAPES) ×2 IMPLANT
DRAPE UTILITY 15X26 TOWEL STRL (DRAPES) ×2 IMPLANT
FILTER OVERFLOW MORCELLATOR (FILTER) ×1 IMPLANT
GLOVE BIO SURGEON STRL SZ 6.5 (GLOVE) ×4 IMPLANT
GOWN STRL REUS W/ TWL LRG LVL3 (GOWN DISPOSABLE) ×2 IMPLANT
GOWN STRL REUS W/TWL LRG LVL3 (GOWN DISPOSABLE) ×2
GUIDEWIRE STR DUAL SENSOR (WIRE) IMPLANT
HOLDER FOLEY CATH W/STRAP (MISCELLANEOUS) ×2 IMPLANT
KIT TURNOVER CYSTO (KITS) ×2 IMPLANT
LASER FIBER 550M SMARTSCOPE (Laser) ×2 IMPLANT
MORCELLATOR COLLECT CONTAINER (MISCELLANEOUS) ×2
MORCELLATOR OVERFLOW FILTER (FILTER) ×2
MORCELLATOR ROTATION 4.75 335 (MISCELLANEOUS) ×2 IMPLANT
PACK CYSTO AR (MISCELLANEOUS) ×2 IMPLANT
SET CYSTO W/LG BORE CLAMP LF (SET/KITS/TRAYS/PACK) IMPLANT
SET IRRIG Y TYPE TUR BLADDER L (SET/KITS/TRAYS/PACK) ×2 IMPLANT
SLEEVE PROTECTION STRL DISP (MISCELLANEOUS) ×4 IMPLANT
SOL .9 NS 3000ML IRR  AL (IV SOLUTION) ×4
SOL .9 NS 3000ML IRR UROMATIC (IV SOLUTION) ×4 IMPLANT
SYRINGE IRR TOOMEY STRL 70CC (SYRINGE) ×2 IMPLANT
TUBE PUMP MORCELLATOR PIRANHA (TUBING) ×2 IMPLANT
WATER STERILE IRR 1000ML POUR (IV SOLUTION) ×2 IMPLANT

## 2018-12-14 NOTE — Discharge Instructions (Signed)
You have a Foley catheter in place.  On Wednesday morning, you may choose to remove the catheter yourself.  The nursing staff will teach you to do this.  Hook the 30 cc syringe up to the balloon port and aspirate all of the fluid.  Once all the fluid is removed, he should build to pull the catheter gently until the entire Foley is removed.  If you are unable to do this or would prefer to have a removed our office, please present on Wednesday to the office for Foley removal.  It is normal to have blood in your urine.  If there are large clots or the catheter is not draining, please call our office or go to the emergency room.  Indwelling Urinary Catheter Care, Adult An indwelling urinary catheter is a thin tube that is put into your bladder. The tube helps to drain pee (urine) out of your body. The tube goes in through your urethra. Your urethra is where pee comes out of your body. Your pee will come out through the catheter, then it will go into a bag (drainage bag). Take good care of your catheter so it will work well. How to wear your catheter and bag Supplies needed  Sticky tape (adhesive tape) or a leg strap.  Alcohol wipe or soap and water (if you use tape).  A clean towel (if you use tape).  Large overnight bag.  Smaller bag (leg bag). Wearing your catheter Attach your catheter to your leg with tape or a leg strap.  Make sure the catheter is not pulled tight.  If a leg strap gets wet, take it off and put on a dry strap.  If you use tape to hold the bag on your leg: 1. Use an alcohol wipe or soap and water to wash your skin where the tape made it sticky before. 2. Use a clean towel to pat-dry that skin. 3. Use new tape to make the bag stay on your leg. Wearing your bags You should have been given a large overnight bag.  You may wear the overnight bag in the day or night.  Always have the overnight bag lower than your bladder.  Do not let the bag touch the floor.  Before  you go to sleep, put a clean plastic bag in a wastebasket. Then hang the overnight bag inside the wastebasket. You should also have a smaller leg bag that fits under your clothes.  Always wear the leg bag below your knee.  Do not wear your leg bag at night. How to care for your skin and catheter Supplies needed  A clean washcloth.  Water and mild soap.  A clean towel. Caring for your skin and catheter      Clean the skin around your catheter every day: ? Wash your hands with soap and water. ? Wet a clean washcloth in warm water and mild soap. ? Clean the skin around your urethra. ? If you are male: ? Gently spread the folds of skin around your vagina (labia). ? With the washcloth in your other hand, wipe the inner side of your labia on each side. Wipe from front to back. ? If you are male: ? Pull back any skin that covers the end of your penis (foreskin). ? With the washcloth in your other hand, wipe your penis in small circles. Start wiping at the tip of your penis, then move away from the catheter. ? Move the foreskin back in place, if needed. ?  With your free hand, hold the catheter close to where it goes into your body. ? Keep holding the catheter during cleaning so it does not get pulled out. ? With the washcloth in your other hand, clean the catheter. ? Only wipe downward on the catheter. ? Do not wipe upward toward your body. Doing this may push germs into your urethra and cause infection. ? Use a clean towel to pat-dry the catheter and the skin around it. Make sure to wipe off all soap. ? Wash your hands with soap and water.  Shower every day. Do not take baths.  Do not use cream, ointment, or lotion on the area where the catheter goes into your body, unless your doctor tells you to.  Do not use powders, sprays, or lotions on your genital area.  Check your skin around the catheter every day for signs of infection. Check for: ? Redness, swelling, or  pain. ? Fluid or blood. ? Warmth. ? Pus or a bad smell. How to empty the bag Supplies needed  Rubbing alcohol.  Gauze pad or cotton ball.  Tape or a leg strap. Emptying the bag Pour the pee out of your bag when it is ?- full, or at least 2-3 times a day. Do this for your overnight bag and your leg bag. 1. Wash your hands with soap and water. 2. Separate (detach) the bag from your leg. 3. Hold the bag over the toilet or a clean pail. Keep the bag lower than your hips and bladder. This is so the pee (urine) does not go back into the tube. 4. Open the pour spout. It is at the bottom of the bag. 5. Empty the pee into the toilet or pail. Do not let the pour spout touch any surface. 6. Put rubbing alcohol on a gauze pad or cotton ball. 7. Use the gauze pad or cotton ball to clean the pour spout. 8. Close the pour spout. 9. Attach the bag to your leg with tape or a leg strap. 10. Wash your hands with soap and water. Follow instructions for cleaning the drainage bag:  From the product maker.  As told by your doctor. How to change the bag Supplies needed  Alcohol wipes.  A clean bag.  Tape or a leg strap. Changing the bag Replace your bag when it starts to leak, smell bad, or look dirty. 1. Wash your hands with soap and water. 2. Separate the dirty bag from your leg. 3. Pinch the catheter with your fingers so that pee does not spill out. 4. Separate the catheter tube from the bag tube where these tubes connect (at the connection valve). Do not let the tubes touch any surface. 5. Clean the end of the catheter tube with an alcohol wipe. Use a different alcohol wipe to clean the end of the bag tube. 6. Connect the catheter tube to the tube of the clean bag. 7. Attach the clean bag to your leg with tape or a leg strap. Do not make the bag tight on your leg. 8. Wash your hands with soap and water. General rules   Never pull on your catheter. Never try to take it out. Doing that  can hurt you.  Always wash your hands before and after you touch your catheter or bag. Use a mild, fragrance-free soap. If you do not have soap and water, use hand sanitizer.  Always make sure there are no twists or bends (kinks) in the catheter tube.  Always make sure there are no leaks in the catheter or bag.  Drink enough fluid to keep your pee pale yellow.  Do not take baths, swim, or use a hot tub.  If you are male, wipe from front to back after you poop (have a bowel movement). Contact a doctor if:  Your pee is cloudy.  Your pee smells worse than usual.  Your catheter gets clogged.  Your catheter leaks.  Your bladder feels full. Get help right away if:  You have redness, swelling, or pain where the catheter goes into your body.  You have fluid, blood, pus, or a bad smell coming from the area where the catheter goes into your body.  Your skin feels warm where the catheter goes into your body.  You have a fever.  You have pain in your: ? Belly (abdomen). ? Legs. ? Lower back. ? Bladder.  You see blood in the catheter.  Your pee is pink or red.  You feel sick to your stomach (nauseous).  You throw up (vomit).  You have chills.  Your pee is not draining into the bag.  Your catheter gets pulled out. Summary  An indwelling urinary catheter is a thin tube that is placed into the bladder to help drain pee (urine) out of the body.  The catheter is placed into the part of the body that drains pee from the bladder (urethra).  Taking good care of your catheter will keep it working properly and help prevent problems.  Always wash your hands before and after touching your catheter or bag.  Never pull on your catheter or try to take it out. This information is not intended to replace advice given to you by your health care provider. Make sure you discuss any questions you have with your health care provider. Document Released: 09/14/2012 Document Revised:  09/11/2018 Document Reviewed: 01/03/2017 Elsevier Patient Education  [place]   1) The drugs that you were given will stay in your system until tomorrow so for the next 24 hours you should not:  A) Drive an automobile B) Make any legal decisions C) Drink any alcoholic beverage   2) You may resume regular meals tomorrow.  Today it is better to start with liquids and gradually work up to solid foods.  You may eat anything you prefer, but it is better to start with liquids, then soup and crackers, and gradually work up to solid foods.   3) Please notify your doctor immediately if you have any unusual bleeding, trouble breathing, redness and pain at the surgery site, drainage, fever, or pain not relieved by medication.    4) Additional Instructions:        Please contact your physician with any problems or Same Day Surgery at [phone], Monday through Friday 6 am to 4 pm, or [place] at [facility] number at [phone].

## 2018-12-14 NOTE — Anesthesia Preprocedure Evaluation (Signed)
Anesthesia Evaluation  Patient identified by MRN, date of birth, ID band Patient awake    Reviewed: Allergy & Precautions, NPO status , Patient's Chart, lab work & pertinent test results  Airway Mallampati: II  TM Distance: >3 FB Neck ROM: Full    Dental  (+) Caps Molar caps:   Pulmonary neg pulmonary ROS,    Pulmonary exam normal breath sounds clear to auscultation       Cardiovascular hypertension, Pt. on medications  Rhythm:Regular Rate:Normal  Increased lipids   Neuro/Psych negative neurological ROS  negative psych ROS   GI/Hepatic negative GI ROS, Neg liver ROS,   Endo/Other  diabetes, Well Controlled, Type 2  Renal/GU stones  negative genitourinary   Musculoskeletal negative musculoskeletal ROS (+)   Abdominal Normal abdominal exam  (+)   Peds negative pediatric ROS (+)  Hematology negative hematology ROS (+)   Anesthesia Other Findings   Reproductive/Obstetrics negative OB ROS                             Anesthesia Physical  Anesthesia Plan  ASA: II  Anesthesia Plan: General   Post-op Pain Management:    Induction: Intravenous  PONV Risk Score and Plan:   Airway Management Planned: Oral ETT  Additional Equipment:   Intra-op Plan:   Post-operative Plan: Extubation in OR  Informed Consent: I have reviewed the patients History and Physical, chart, labs and discussed the procedure including the risks, benefits and alternatives for the proposed anesthesia with the patient or authorized representative who has indicated his/her understanding and acceptance.       Plan Discussed with: CRNA and Surgeon  Anesthesia Plan Comments:         Anesthesia Quick Evaluation

## 2018-12-14 NOTE — OR Nursing (Signed)
Wedding band to left hand on,patient unable to remove. No cautery used.

## 2018-12-14 NOTE — Transfer of Care (Signed)
Immediate Anesthesia Transfer of Care Note  Patient: Kristopher Christensen  Procedure(s) Performed: HOLEP-LASER ENUCLEATION OF THE PROSTATE WITH MORCELLATION (N/A Prostate)  Patient Location: PACU  Anesthesia Type:General  Level of Consciousness: sedated  Airway & Oxygen Therapy: Patient Spontanous Breathing and Patient connected to face mask oxygen  Post-op Assessment: Report given to RN and Post -op Vital signs reviewed and stable  Post vital signs: Reviewed and stable  Last Vitals:  Vitals Value Taken Time  BP 161/81 12/14/18 1107  Temp    Pulse 63 12/14/18 1117  Resp 16 12/14/18 1117  SpO2 100 % 12/14/18 1117  Vitals shown include unvalidated device data.  Last Pain:  Vitals:   12/14/18 1107  TempSrc:   PainSc: Asleep         Complications: No apparent anesthesia complications

## 2018-12-14 NOTE — Progress Notes (Signed)
Pt states he would like foley out in office, appt made, also wishes to keep things easy and kept large bag, taught cleaning and how to empty foley to pt

## 2018-12-14 NOTE — Anesthesia Post-op Follow-up Note (Signed)
Anesthesia QCDR form completed.        

## 2018-12-14 NOTE — Anesthesia Postprocedure Evaluation (Signed)
Anesthesia Post Note  Patient: Kristopher Christensen  Procedure(s) Performed: HOLEP-LASER ENUCLEATION OF THE PROSTATE WITH MORCELLATION (N/A Prostate)  Patient location during evaluation: PACU Anesthesia Type: General Level of consciousness: awake and alert and oriented Pain management: pain level controlled Vital Signs Assessment: post-procedure vital signs reviewed and stable Respiratory status: spontaneous breathing Cardiovascular status: blood pressure returned to baseline Anesthetic complications: no     Last Vitals:  Vitals:   12/14/18 1207 12/14/18 1215  BP: (!) 183/91 (!) 161/97  Pulse: 64 76  Resp: 12 20  Temp: 36.4 C (!) 36.1 C  SpO2: 96% 98%    Last Pain:  Vitals:   12/14/18 1238  TempSrc:   PainSc: 5                  Shyann Hefner

## 2018-12-14 NOTE — Anesthesia Procedure Notes (Signed)
Procedure Name: Intubation Date/Time: 12/14/2018 10:16 AM Performed by: Justus Memory, CRNA Pre-anesthesia Checklist: Patient identified, Patient being monitored, Timeout performed, Emergency Drugs available and Suction available Patient Re-evaluated:Patient Re-evaluated prior to induction Oxygen Delivery Method: Circle system utilized Preoxygenation: Pre-oxygenation with 100% oxygen Induction Type: IV induction Ventilation: Mask ventilation without difficulty Laryngoscope Size: Mac and 3 Grade View: Grade I Tube type: Oral Tube size: 7.0 mm Number of attempts: 1 Airway Equipment and Method: Stylet Placement Confirmation: ETT inserted through vocal cords under direct vision,  positive ETCO2 and breath sounds checked- equal and bilateral Secured at: 21 cm Tube secured with: Tape Dental Injury: Teeth and Oropharynx as per pre-operative assessment

## 2018-12-14 NOTE — Op Note (Signed)
Date of procedure: 12/14/18  Preoperative diagnosis:  1. BPH with urinary obstruction  Postoperative diagnosis:  1. Same as above  Procedure: 1. Holmium laser enucleation of the prostate with morcellation  Surgeon: Hollice Espy, MD  Anesthesia: General  Complications: None  Intraoperative findings: Large bilobed median lobe with significant intravesical protrusion/median lobe appreciated.  Minimal lateral lobes with a short prostatic fossa.  Mild trabeculation.  EBL: 50 cc  Specimens: Prostate chips  Drains: 20 French two-way Foley catheter with 30 cc balloon  Indication: Kristopher Christensen is a 68 y.o. patient with BPH with refractory symptoms found have a large median lobe.  After reviewing the management options for treatment, he elected to proceed with the above surgical procedure(s).  Specifically, he desires resection of his median lobe with sparing of his lateral lobes and ejaculatory ducts if possible to preserve his ejaculatory function .  He understands that there is still a chance that he may have ejaculatory dysfunction and retrograde ejaculation.  We have discussed the potential benefits and risks of the procedure, side effects of the proposed treatment, the likelihood of the patient achieving the goals of the procedure, and any potential problems that might occur during the procedure or recuperation. Informed consent has been obtained.  Description of procedure:  The patient was taken to the operating room and general anesthesia was induced.  The patient was placed in the dorsal lithotomy position, prepped and draped in the usual sterile fashion, and preoperative antibiotics were administered. A preoperative time-out was performed.   A 26 French resectoscope using a blunt angled obturator was introduced into the bladder.  The laser bridge was then brought in and the bladder is carefully inspected.  Notably, the bladder was mildly trabeculated.  There was a large ball valving  median lobe which is bivalved in appearance protruding into the base of the bladder.  Due to the presence of this elevated bladder neck, the trigone was not immediately visible initially.  The prostatic fossa itself was relatively short with small lateral lobes.  At this point time, 550 m laser fiber was then brought in and using the settings of 1.8 J and 51 Hz, 2 incisions were created on either side of the lateralmost aspect of the bivalved median lobe.  This was deepened until bladder neck fibers are visualized.  A curvilinear incision was then created underneath this lobe to level the mid prostate with care to avoid any resection beyond this nowhere near the ejaculatory ducts.  The median lobe was then enucleated and a caudocranial fashion by identifying the capsular fibers and incising along with rolling the median lobe up towards the bladder neck and ultimately cleaving off of the bladder neck until was freed into the bladder.  Careful and adequate hemostasis was achieved of bleeding blood vessels using 1.5 J and 30 Hz.  The bladder neck was then inspected and the trigone was seen with no injury to the ureteral orifice ease which are good distance from the bladder neck itself.  Again, within the prostatic fossa, the lateral lobes were quite small and did not have an obstructing appearance.  Finally, using a nephroscope and morcellator, the median lobe was morcellated by filling the bladder with care taken to avoid any injury to the bladder itself.  This point, time, 10 mg of IV Lasix was administered and the urine began to clear.  The scope was removed and a 30 cc 20 French catheter was inserted using a catheter guide.  The bladder was  irrigated which irrigated well after the balloon was filled.  A small amount of traction was placed on the balloon.  The patient was then clean and dry, repositioned supine position, reversed myesthesia, taken the PACU in stable condition.  Plan: Foley catheter to be left in  traction for 1 hour.  Will be discharged home with Foley catheter in place x48 hours.  He will be taught how to remove his own Foley should he desire to do so.  He will follow-up in 6 weeks thereafter.  Findings discussed with the patient's wife.  Hollice Espy, M.D.

## 2018-12-14 NOTE — H&P (Signed)
12/14/18 9:50 AM   Kristopher Christensen [birth year] [id number]   HPI: 68 year old male with severe both irritative and obstructive voiding symptoms who presents today via virtual visit.  He was last seen 08/05/2018.  That time he underwent cystoscopy and prostate sizing.  His prostate was relatively small at 36.2 g with there is a well-circumscribed median lobe protruding into the base of the bladder.  Has had a ball-valve appearance.  He was previously on Flomax which failed to improve his symptoms.  His symptoms primarily are difficulty starting a stream, weak stream, sensation of incomplete bladder emptying and nocturia x3-4.  He also has some daytime frequency.  He is relatively bothered by his symptoms.  He was initially scheduled for holmium laser enucleation primarily of his median lobe but has some questions today.  Due to COVID-19, surgery was rescheduled.  He is concerned today about sexual function and ejaculatory function.  He was previously interested in the UroLift procedure but due to the presence of a large median lobe, we discussed resection.  He does have baseline erectile dysfunction but responds well to Viagra 100 mg.   PMH: Past Medical History:  Diagnosis Date  . Cancer ([place])    Basal cell cancer of lt.lower leg  . Diabetes mellitus without complication ([place])   . Elevated lipids   . Enlarged prostate   . Heartburn   . Kidney stone     Surgical History: Past Surgical History:  Procedure Laterality Date  . COLONOSCOPY N/A 10/14/2014   Procedure: COLONOSCOPY;  Surgeon: Hulen Luster, MD;  Location: [facility] ENDOSCOPY;  Service: Gastroenterology;  Laterality: N/A;  . UMBILICAL HERNIA REPAIR      Home Medications:  Current Meds  Medication Sig  . aspirin 81 MG tablet Take 81 mg by mouth every evening.   Marland Kitchen glimepiride (AMARYL) 2 MG tablet Take 2 mg by mouth daily with breakfast.   . lisinopril (PRINIVIL,ZESTRIL) 2.5 MG tablet Take 2.5 mg by mouth daily.  Marland Kitchen  lovastatin (MEVACOR) 20 MG tablet Take 20 mg by mouth at bedtime.  . metFORMIN (GLUCOPHAGE) 1000 MG tablet Take 1,000-1,500 mg by mouth See admin instructions. Take 1000 mg by mouth in the morning and take 1500 mg by mouth in the evening with meals  . Multiple Vitamins-Minerals (MULTIVITAMIN ADULT PO) Take 1 tablet by mouth daily.   Marland Kitchen OVER THE COUNTER MEDICATION Take 1 tablet by mouth daily. Dietary Mushroom Supplement  . sildenafil (VIAGRA) 100 MG tablet Take 1 tablet (100 mg total) by mouth daily as needed for erectile dysfunction. (Patient taking differently: Take 50 mg by mouth daily as needed for erectile dysfunction. )     Allergies: No Known Allergies  Family History: Family History  Problem Relation Age of Onset  . Prostate cancer Neg Hx   . Kidney disease Neg Hx   . Bladder Cancer Neg Hx     Social History:  reports that he has never smoked. He has never used smokeless tobacco. He reports current alcohol use of about 5.0 standard drinks of alcohol per week. He reports that he does not use drugs.  ROS: Negative  Physical Exam: BP (!) 181/82   Pulse 70   Temp (!) 97.5 F (36.4 C) (Temporal)   Resp 16   Ht 5\' 11"  (1.803 m)   Wt 84 kg   SpO2 100%   BMI 25.83 kg/m   Constitutional:  Alert and oriented, No acute distress. HEENT: [place] AT, moist mucus membranes.  Trachea midline,  no masses. Cardiovascular: No clubbing, cyanosis, or edema. RRR. Respiratory: Normal respiratory effort, no increased work of breathing. CTAB. GI: Abdomen is soft, nontender, nondistended, no abdominal masses GU: No CVA tenderness  Skin: No rashes, bruises or suspicious lesions. Neurologic: Grossly intact, no focal deficits, moving all 4 extremities. Psychiatric: Normal mood and affect.  Urinalysis    Component Value Date/Time   APPEARANCEUR Clear 12/03/2018 1000   GLUCOSEU Trace (A) 12/03/2018 1000   BILIRUBINUR Negative 12/03/2018 1000   PROTEINUR Negative 12/03/2018 1000   NITRITE  Negative 12/03/2018 1000   LEUKOCYTESUR Negative 12/03/2018 1000    Lab Results  Component Value Date   LABMICR See below: 12/03/2018   WBCUA 0-5 12/03/2018   RBCUA None seen 08/05/2018   LABEPIT 0-10 12/03/2018   MUCUS Present (A) 12/03/2018   BACTERIA None seen 12/03/2018    Assessment & Plan:    1. BPH with obstruction/lower urinary tract symptoms Lengthy discussion to given via virtual visit about his various treatment options for poorly controlled symptoms  Given the presence of a large median lobe and relatively small gland, I have strongly recommended resection of his median lobe which can be done either via traditional TURP, laser enucleation or laser ablation.  Gust with her new indications for UroLift for median lobe but I personally have relatively limited expertise with this technique this would consider either second opinion or the aforementioned.  His lateral lobes do not have a particular obstructive appearance.  We discussed the risks and benefits of the above.  He is very concerned about his ejaculatory dysfunction postoperatively.  As such, I am a strongly recommend holmium laser enucleation of the median lobe only and leave the lateral lobes intact.  We discussed that if his symptoms resolve, we need no further intervention in his ejaculation should remain intact.  If his symptoms persist, we could always go back and UroLift the lateral lobes.  He is agreeable this hybrid technique plan.  Discussed the risk at length including risk of bleeding, infection, damage surrounding structures amongst others.  We discussed the risk of stress incontinence and ejaculatory function at length as well although unlikely.  All questions were answered.  He understands to be an outpatient procedure and will need catheter for approximately 2 days postop.  2. Erectile dysfunction, unspecified erectile dysfunction type Continue Viagra as needed   Hollice Espy, MD  [facility] [address], [place], [place] [address] [phone]

## 2018-12-15 ENCOUNTER — Encounter: Payer: Self-pay | Admitting: Urology

## 2018-12-16 ENCOUNTER — Other Ambulatory Visit: Payer: Self-pay

## 2018-12-16 ENCOUNTER — Ambulatory Visit (INDEPENDENT_AMBULATORY_CARE_PROVIDER_SITE_OTHER): Payer: Medicare HMO | Admitting: Family Medicine

## 2018-12-16 DIAGNOSIS — N401 Enlarged prostate with lower urinary tract symptoms: Secondary | ICD-10-CM

## 2018-12-16 DIAGNOSIS — N138 Other obstructive and reflux uropathy: Secondary | ICD-10-CM | POA: Diagnosis not present

## 2018-12-16 NOTE — Patient Instructions (Signed)

## 2018-12-16 NOTE — Progress Notes (Signed)
Patient returned this afternoon after foley removal this morning complaining of bladder pain and very little urine output.  Bladder scan was 46ml  Per Dr. Diamantina Providence replace foley cath for 1 wk  Simple Catheter Placement  Due to urinary retention patient is present today for a foley cath placement.  Patient was cleaned and prepped in a sterile fashion with betadine and lidocaine jelly 2% was instilled into the urethra.  A 20 coude FR foley catheter was inserted, urine return was noted  455ml, urine was amber in color.  The balloon was filled with 10cc of sterile water.  A leg bag was attached for drainage. Patient was also given a night bag to take home and was given instruction on how to change from one bag to another.  Patient was given instruction on proper catheter care.  Patient tolerated well, no complications were noted   Preformed by: Kristopher Christensen, CMA  Additional notes/ Follow up: 1wk foley removal

## 2018-12-16 NOTE — Progress Notes (Signed)
Catheter Removal  Patient is present today for a catheter removal.  61ml of water was drained from the balloon. A 20FR foley cath was removed from the bladder no complications were noted . Patient tolerated well.  Preformed by: Elberta Leatherwood, CMA  Follow up/ Additional notes: As scheduled

## 2018-12-17 LAB — SURGICAL PATHOLOGY

## 2018-12-23 ENCOUNTER — Other Ambulatory Visit: Payer: Self-pay

## 2018-12-23 ENCOUNTER — Ambulatory Visit (INDEPENDENT_AMBULATORY_CARE_PROVIDER_SITE_OTHER): Payer: Medicare HMO

## 2018-12-23 DIAGNOSIS — R339 Retention of urine, unspecified: Secondary | ICD-10-CM

## 2018-12-23 LAB — BLADDER SCAN AMB NON-IMAGING

## 2018-12-23 NOTE — Progress Notes (Signed)
Catheter Removal  Patient is present today for a catheter removal.  87ml of water was drained from the balloon. A 20FR foley cath was removed from the bladder no complications were noted . Patient tolerated well.  Preformed by: Gordy Clement, [place] ([facility])  Follow up/ Additional notes: RTC this afternoon for PVR    Pt returns to clinic for afternoon PVR. PVR reading is 75mL. Pt to follow up as scheduled in a few weeks.

## 2019-01-18 ENCOUNTER — Other Ambulatory Visit: Payer: Self-pay

## 2019-01-18 DIAGNOSIS — N529 Male erectile dysfunction, unspecified: Secondary | ICD-10-CM

## 2019-01-18 MED ORDER — SILDENAFIL CITRATE 100 MG PO TABS: 100.0000 mg | ORAL_TABLET | Freq: Every day | ORAL | 6 refills | Status: DC | PRN

## 2019-01-19 ENCOUNTER — Other Ambulatory Visit: Payer: Self-pay

## 2019-01-19 DIAGNOSIS — N529 Male erectile dysfunction, unspecified: Secondary | ICD-10-CM

## 2019-01-19 MED ORDER — SILDENAFIL CITRATE 100 MG PO TABS: 100.0000 mg | ORAL_TABLET | Freq: Every day | ORAL | 6 refills | Status: DC | PRN

## 2019-01-19 NOTE — Telephone Encounter (Signed)
Per [employer] message from pt he states that RX for sildenafil should be sent to [facility] as it is more cost effective. RX sent to [facility] cancelled script at [facility].

## 2019-01-26 ENCOUNTER — Ambulatory Visit (INDEPENDENT_AMBULATORY_CARE_PROVIDER_SITE_OTHER): Payer: Medicare HMO | Admitting: Urology

## 2019-01-26 ENCOUNTER — Encounter: Payer: Self-pay | Admitting: Urology

## 2019-01-26 ENCOUNTER — Other Ambulatory Visit: Payer: Self-pay

## 2019-01-26 VITALS — BP 179/74 | HR 66 | Ht 71.0 in | Wt 184.0 lb

## 2019-01-26 DIAGNOSIS — N138 Other obstructive and reflux uropathy: Secondary | ICD-10-CM

## 2019-01-26 DIAGNOSIS — R351 Nocturia: Secondary | ICD-10-CM

## 2019-01-26 DIAGNOSIS — N401 Enlarged prostate with lower urinary tract symptoms: Secondary | ICD-10-CM

## 2019-01-26 LAB — BLADDER SCAN AMB NON-IMAGING

## 2019-01-26 NOTE — Progress Notes (Signed)
01/26/2019 8:37 AM   Kristopher Christensen Nov 14, 1950 [id number]  Referring provider: Dion Body, MD [facility] [place],  [place] [address]  Chief Complaint  Patient presents with  . Routine Post Op    HPI: 68 year old male with refractory urinary symptoms found have a relatively small prostate with a large obstructing median lobe with a ball-valve appearance.  He was taken to the operating room on 12/14/2018 for enucleation of the median lobe only.  Surgical pathology was benign.  Previous IPSS 12/3.Marland Kitchen  Today 10/3.    Overall, he reports that he did well postoperatively.  He has no dysuria gross hematuria or any other complaints.  He reports he has had dramatic improvement in his urinary flow and feels that he is emptying his bladder well.  No significant daytime symptoms.  He continues to get up at night every hour and a half which is unchanged.  He is disappointed with this outcome.  He does have a personal history of diabetes and has been told that he needs to have a sleep study in the past but never pursued this.  IPSS    Row Name 01/26/19 0800         International Prostate Symptom Score   How often have you had the sensation of not emptying your bladder?  Not at All     How often have you had to urinate less than every two hours?  More than half the time     How often have you found you stopped and started again several times when you urinated?  Not at All     How often have you found it difficult to postpone urination?  Less than half the time     How often have you had a weak urinary stream?  Not at All     How often have you had to strain to start urination?  Not at All     How many times did you typically get up at night to urinate?  4 Times     Total IPSS Score  10       Quality of Life due to urinary symptoms   If you were to spend the rest of your life with your urinary condition just the way it is now how would you feel about that?   Mixed        Score:  1-7 Mild 8-19 Moderate 20-35 Severe     PMH: Past Medical History:  Diagnosis Date  . Cancer ([place])    Basal cell cancer of lt.lower leg  . Diabetes mellitus without complication ([place])   . Elevated lipids   . Enlarged prostate   . Heartburn   . Kidney stone     Surgical History: Past Surgical History:  Procedure Laterality Date  . COLONOSCOPY N/A 10/14/2014   Procedure: COLONOSCOPY;  Surgeon: Hulen Luster, MD;  Location: [facility] ENDOSCOPY;  Service: Gastroenterology;  Laterality: N/A;  . HOLEP-LASER ENUCLEATION OF THE PROSTATE WITH MORCELLATION N/A 12/14/2018   Procedure: HOLEP-LASER ENUCLEATION OF THE PROSTATE WITH MORCELLATION;  Surgeon: Hollice Espy, MD;  Location: [facility] ORS;  Service: Urology;  Laterality: N/A;  . UMBILICAL HERNIA REPAIR      Home Medications:  Allergies as of 01/26/2019   No Known Allergies     Medication List       Accurate as of January 26, 2019  8:37 AM. If you have any questions, ask your nurse or doctor.  STOP taking these medications   HYDROcodone-acetaminophen 5-325 MG tablet Commonly known as: NORCO/VICODIN Stopped by: Hollice Espy, MD   oxybutynin 5 MG tablet Commonly known as: DITROPAN Stopped by: Hollice Espy, MD     TAKE these medications   aspirin 81 MG tablet Take 81 mg by mouth every evening.   glimepiride 2 MG tablet Commonly known as: AMARYL Take 2 mg by mouth daily with breakfast.   lisinopril 2.5 MG tablet Commonly known as: ZESTRIL Take 2.5 mg by mouth daily.   lovastatin 20 MG tablet Commonly known as: MEVACOR Take 20 mg by mouth at bedtime.   metFORMIN 1000 MG tablet Commonly known as: GLUCOPHAGE Take 1,000-1,500 mg by mouth See admin instructions. Take 1000 mg by mouth in the morning and take 1500 mg by mouth in the evening with meals   MULTIVITAMIN ADULT PO Take 1 tablet by mouth daily.   OVER THE COUNTER MEDICATION Take 1 tablet by mouth daily. Dietary Mushroom  Supplement   sildenafil 100 MG tablet Commonly known as: VIAGRA Take 1 tablet (100 mg total) by mouth daily as needed for erectile dysfunction.       Allergies: No Known Allergies  Family History: Family History  Problem Relation Age of Onset  . Prostate cancer Neg Hx   . Kidney disease Neg Hx   . Bladder Cancer Neg Hx     Social History:  reports that he has never smoked. He has never used smokeless tobacco. He reports current alcohol use of about 5.0 standard drinks of alcohol per week. He reports that he does not use drugs.  ROS: UROLOGY Frequent Urination?: Yes Hard to postpone urination?: No Burning/pain with urination?: No Get up at night to urinate?: Yes Leakage of urine?: No Urine stream starts and stops?: No Trouble starting stream?: No Do you have to strain to urinate?: No Blood in urine?: No Urinary tract infection?: No Sexually transmitted disease?: No Injury to kidneys or bladder?: No Painful intercourse?: No Weak stream?: No Erection problems?: Yes Penile pain?: No Currently pregnant?: No Vaginal bleeding?: No Last menstrual period?: N  Gastrointestinal Nausea?: No Vomiting?: No Indigestion/heartburn?: No Diarrhea?: No Constipation?: No  Constitutional Fever: No Night sweats?: No Weight loss?: No Fatigue?: No  Skin Skin rash/lesions?: No Itching?: No  Eyes Blurred vision?: No Double vision?: No  Ears/Nose/Throat Sore throat?: No Sinus problems?: No  Hematologic/Lymphatic Swollen glands?: No Easy bruising?: No  Cardiovascular Leg swelling?: No Chest pain?: No  Respiratory Cough?: No Shortness of breath?: No  Endocrine Excessive thirst?: No  Musculoskeletal Back pain?: No Joint pain?: No  Neurological Headaches?: No Dizziness?: No  Psychologic Depression?: No Anxiety?: No  Physical Exam: BP (!) 179/74   Pulse 66   Ht 5\' 11"  (1.803 m)   Wt 184 lb (83.5 kg)   BMI 25.66 kg/m   Constitutional:  Alert and  oriented, No acute distress. HEENT: [place] AT, moist mucus membranes.  Trachea midline, no masses. Cardiovascular: No clubbing, cyanosis, or edema. Respiratory: Normal respiratory effort, no increased work of breathing. Skin: No rashes, bruises or suspicious lesions. Neurologic: Grossly intact, no focal deficits, moving all 4 extremities. Psychiatric: Normal mood and affect.   Pertinent Imaging: Results for orders placed or performed in visit on 01/26/19  Bladder Scan (Post Void Residual) in office  Result Value Ref Range   Scan Result 14ml     Assessment & Plan:    1. BPH with obstruction/lower urinary tract symptoms Personal history of BPH with mixed urinary symptoms  status post enucleation of a large ball valving median lobe Improved urinary flow with adequate emptying today Continues have nighttime symptoms as below Not currently on any BPH meds Due for prostate cancer screening 07/2019 - Bladder Scan (Post Void Residual) in office  2. Nocturia Continues to have nocturia every 1-1/2 hours We discussed that this is likely multifactorial given that we have addressed his outlet He will discuss pursuing sleep study with his primary care, Dr. Aletha Halim modification was also discussed Discussed pharmacotherapy, not interested at this time   Return in about 6 months (around 07/29/2019) for IPSS/ PVR/ PSA/ DRE.  Hollice Espy, MD  [facility] [address], [place], [place] [address] [phone]

## 2019-06-21 DIAGNOSIS — G4733 Obstructive sleep apnea (adult) (pediatric): Secondary | ICD-10-CM | POA: Insufficient documentation

## 2019-07-17 ENCOUNTER — Ambulatory Visit: Payer: Medicare HMO | Attending: Internal Medicine

## 2019-07-17 DIAGNOSIS — Z23 Encounter for immunization: Secondary | ICD-10-CM | POA: Insufficient documentation

## 2019-07-17 NOTE — Progress Notes (Signed)
   Covid-19 Vaccination Clinic  Name:  Kristopher Christensen    MRN: [id number] DOB: 1950-09-12  07/17/2019  Mr. [name] was observed post Covid-19 immunization for 15 minutes without incidence. He was provided with Vaccine Information Sheet and instruction to access the V-Safe system.   Mr. [name] was instructed to call 911 with any severe reactions post vaccine: Marland Kitchen Difficulty breathing  . Swelling of your face and throat  . A fast heartbeat  . A bad rash all over your body  . Dizziness and weakness    Immunizations Administered    Name Date Dose VIS Date Route   Pfizer COVID-19 Vaccine 07/17/2019  9:06 AM 0.3 mL 05/14/2019 Intramuscular   Manufacturer: [place]   Lot: [id number]   [name]: [id number]

## 2019-07-28 ENCOUNTER — Other Ambulatory Visit: Payer: Self-pay | Admitting: *Deleted

## 2019-07-28 DIAGNOSIS — N138 Other obstructive and reflux uropathy: Secondary | ICD-10-CM

## 2019-07-29 ENCOUNTER — Other Ambulatory Visit: Payer: Medicare HMO

## 2019-07-29 ENCOUNTER — Other Ambulatory Visit: Payer: Self-pay

## 2019-07-29 DIAGNOSIS — N138 Other obstructive and reflux uropathy: Secondary | ICD-10-CM

## 2019-07-30 LAB — PSA: Prostate Specific Ag, Serum: 0.8 ng/mL (ref 0.0–4.0)

## 2019-08-02 NOTE — Progress Notes (Addendum)
08/03/2019 3:07 PM   Kristopher Christensen [birth year] [id number]  Referring provider: Dion Body, MD [place] [facility] [place],  [place] [address]  Chief Complaint  Patient presents with  . Benign Prostatic Hypertrophy    HPI: Kristopher Christensen is a 69 yo M with a hx of BPH with obstruction/LUTs, nocturia and diabetes returns today for a 6 month f/u visit.   He was taken to the operating room on 12/14/2018 for enucleation of the median lobe only. Surgical pathology was benign.  His most recent PSA from 07/30/2019 was 0.8.   Previous IPSS 10/3. Today IPSS 4.   He reports of nocturia x2. He reports of excellent stream, no medication. Day time symptoms are well controlled and night time symptoms are improving. He has moderate sleep apnea given his sleep study. Since starting CPAP, night time symptoms dramatically improved.   IPSS    Row Name 08/03/19 1000         International Prostate Symptom Score   How often have you had the sensation of not emptying your bladder?  Not at All     How often have you had to urinate less than every two hours?  Less than 1 in 5 times     How often have you found you stopped and started again several times when you urinated?  Not at All     How often have you found it difficult to postpone urination?  Less than 1 in 5 times     How often have you had a weak urinary stream?  Not at All     How often have you had to strain to start urination?  Not at All     How many times did you typically get up at night to urinate?  2 Times     Total IPSS Score  4       Quality of Life due to urinary symptoms   If you were to spend the rest of your life with your urinary condition just the way it is now how would you feel about that?  Mixed        Score:  1-7 Mild 8-19 Moderate 20-35 Severe   Component     Latest Ref Rng & Units 05/09/2016 07/11/2017 07/08/2018 07/29/2019  Prostate Specific Ag, Serum     0.0 - 4.0 ng/mL 0.9 1.0 1.3 0.8     PMH: Past Medical History:  Diagnosis Date  . Cancer ([place])    Basal cell cancer of lt.lower leg  . Diabetes mellitus without complication ([place])   . Elevated lipids   . Enlarged prostate   . Heartburn   . Kidney stone     Surgical History: Past Surgical History:  Procedure Laterality Date  . COLONOSCOPY N/A 10/14/2014   Procedure: COLONOSCOPY;  Surgeon: Hulen Luster, MD;  [facility] ENDOSCOPY;  Service: Gastroenterology;  Laterality: N/A;  . HOLEP-LASER ENUCLEATION OF THE PROSTATE WITH MORCELLATION N/A 12/14/2018   Procedure: HOLEP-LASER ENUCLEATION OF THE PROSTATE WITH MORCELLATION;  Surgeon: Hollice Espy, MD;  Location: [facility] ORS;  Service: Urology;  Laterality: N/A;  . UMBILICAL HERNIA REPAIR      Home Medications:  Allergies as of 08/03/2019   No Known Allergies     Medication List       Accurate as of August 03, 2019  3:07 PM. If you have any questions, ask your nurse or doctor.        aspirin 81 MG  tablet Take 81 mg by mouth every evening.   glimepiride 2 MG tablet Commonly known as: AMARYL Take 2 mg by mouth daily with breakfast.   lisinopril 2.5 MG tablet Commonly known as: ZESTRIL Take 2.5 mg by mouth daily.   lovastatin 20 MG tablet Commonly known as: MEVACOR Take 20 mg by mouth at bedtime.   metFORMIN 1000 MG tablet Commonly known as: GLUCOPHAGE Take 1,000-1,500 mg by mouth See admin instructions. Take 1000 mg by mouth in the morning and take 1500 mg by mouth in the evening with meals   MULTIVITAMIN ADULT PO Take 1 tablet by mouth daily.   OVER THE COUNTER MEDICATION Take 1 tablet by mouth daily. Dietary Mushroom Supplement   sildenafil 100 MG tablet Commonly known as: VIAGRA Take 1 tablet (100 mg total) by mouth daily as needed for erectile dysfunction.       Allergies: No Known Allergies  Family History: Family History  Problem Relation Age of Onset  . Prostate cancer Neg Hx   . Kidney disease Neg Hx   . Bladder Cancer Neg Hx      Social History:  reports that he has never smoked. He has never used smokeless tobacco. He reports current alcohol use of about 5.0 standard drinks of alcohol per week. He reports that he does not use drugs.   Physical Exam: BP (!) 182/55   Pulse (!) 55   Ht 5\' 11"  (1.803 m)   Wt 192 lb (87.1 kg)   BMI 26.78 kg/m   Constitutional:  Alert and oriented, No acute distress. HEENT: [place] AT, moist mucus membranes.  Trachea midline, no masses. Cardiovascular: No clubbing, cyanosis, or edema. Respiratory: Normal respiratory effort, no increased work of breathing. GU: No CVA tenderness. Normal sphincter tone 50 g prostate, no nodules and tenderness  Skin: No rashes, bruises or suspicious lesions. Neurologic: Grossly intact, no focal deficits, moving all 4 extremities. Psychiatric: Normal mood and affect.  Pertinent Imaging: Results for orders placed or performed in visit on 08/03/19  BLADDER SCAN AMB NON-IMAGING  Result Value Ref Range   Scan Result 5 ml     Assessment & Plan:    1. BPH with obstruction/lower urinary tract symptoms Symptoms excellent for daytime, night time symptoms improving  No medication  F/u as needed   2. Prostate cancer screening PSA very low  DRE unremarkable  Recommend annual screening till age 53 by PCP as per recommended by AUA guidelines   3. Nocturia  Nocturia x2, night time symptoms improving  Moderate case of sleep apnea given his sleep study  OK to f/u with PCP given stability of symptoms and no BPH meds, return as needed  [place] [address], [place], [place] [address] [phone]  I, [name], am acting as a scribe for Dr. Hollice Espy,  I have reviewed the above documentation for accuracy and completeness, and I agree with the above.   Hollice Espy, MD

## 2019-08-03 ENCOUNTER — Other Ambulatory Visit: Payer: Self-pay

## 2019-08-03 ENCOUNTER — Ambulatory Visit: Payer: Medicare HMO | Admitting: Urology

## 2019-08-03 VITALS — BP 182/55 | HR 55 | Ht 71.0 in | Wt 192.0 lb

## 2019-08-03 DIAGNOSIS — N138 Other obstructive and reflux uropathy: Secondary | ICD-10-CM

## 2019-08-03 DIAGNOSIS — N401 Enlarged prostate with lower urinary tract symptoms: Secondary | ICD-10-CM | POA: Diagnosis not present

## 2019-08-03 LAB — BLADDER SCAN AMB NON-IMAGING: Scan Result: 5

## 2019-08-10 ENCOUNTER — Ambulatory Visit: Payer: Medicare HMO | Attending: Internal Medicine

## 2019-08-10 DIAGNOSIS — Z23 Encounter for immunization: Secondary | ICD-10-CM | POA: Insufficient documentation

## 2019-08-10 NOTE — Progress Notes (Signed)
   Covid-19 Vaccination Clinic  Name:  Kristopher Christensen    MRN: [id number] DOB: 1950-07-11  08/10/2019  Kristopher Christensen was observed post Covid-19 immunization for 15 minutes without incident. He was provided with Vaccine Information Sheet and instruction to access the V-Safe system.   [name] was instructed to call 911 with any severe reactions post vaccine: Marland Kitchen Difficulty breathing  . Swelling of face and throat  . A fast heartbeat  . A bad rash all over body  . Dizziness and weakness   Immunizations Administered    Name Date Dose VIS Date Route   Pfizer COVID-19 Vaccine 08/10/2019  2:35 PM 0.3 mL 05/14/2019 Intramuscular   Manufacturer: [place]   Lot: [id number]   [place]: [id number]

## 2019-12-28 ENCOUNTER — Encounter: Payer: Self-pay | Admitting: Urology

## 2019-12-28 DIAGNOSIS — N529 Male erectile dysfunction, unspecified: Secondary | ICD-10-CM

## 2019-12-28 MED ORDER — SILDENAFIL CITRATE 100 MG PO TABS: 100.0000 mg | ORAL_TABLET | Freq: Every day | ORAL | 6 refills | Status: DC | PRN

## 2019-12-31 DIAGNOSIS — I10 Essential (primary) hypertension: Secondary | ICD-10-CM | POA: Insufficient documentation

## 2020-11-29 ENCOUNTER — Other Ambulatory Visit: Payer: Self-pay | Admitting: Urology

## 2020-11-29 DIAGNOSIS — N529 Male erectile dysfunction, unspecified: Secondary | ICD-10-CM

## 2021-03-12 ENCOUNTER — Other Ambulatory Visit: Payer: Self-pay | Admitting: Urology

## 2021-03-12 DIAGNOSIS — N529 Male erectile dysfunction, unspecified: Secondary | ICD-10-CM

## 2021-03-20 NOTE — Progress Notes (Signed)
03/21/21 8:57 AM   Kristopher Christensen [birth year] [id number]  Referring provider:  Dion Body, MD [facility] [facility] [place],  [place] [address] Chief Complaint  Patient presents with   Medication Refill     HPI: Kristopher Christensen is a 70 y.o.adult with a personal history of BPH with obstruction/LUTS, nocturia, erectile dysfunction, and diabetes who returns today for follow-up.   Patient was taken into the operating room on 12/14/2018 for enucleation of the median lobe only. Pathology was benign.   He has been doing exceptionally well from a urinary standpoint.  He has no urinary complaints.  Is on no medications.  He is pleased with the result.  Patients most recent PSA on 07/29/2019 was 0.8.   His blood pressure today was elevated his reports that he is on lisinopril which was recently increased.  His blood pressure is 244/91 but has no symptoms related to this.  He states he is just a little bit nervous about the appointment today.   He is here today for refill on sildenafil.    PMH: Past Medical History:  Diagnosis Date   Cancer ([place])    Basal cell cancer of lt.lower leg   Diabetes mellitus without complication ([facility])    Elevated lipids    Enlarged prostate    Heartburn    Kidney stone     Surgical History: Past Surgical History:  Procedure Laterality Date   COLONOSCOPY N/A 10/14/2014   Procedure: COLONOSCOPY;  Surgeon: Hulen Luster, MD;  Location: [facility] ENDOSCOPY;  Service: Gastroenterology;  Laterality: N/A;   HOLEP-LASER ENUCLEATION OF THE PROSTATE WITH MORCELLATION N/A 12/14/2018   Procedure: HOLEP-LASER ENUCLEATION OF THE PROSTATE WITH MORCELLATION;  Surgeon: Hollice Espy, MD;  Location: [facility] ORS;  Service: Urology;  Laterality: N/A;   UMBILICAL HERNIA REPAIR      Home Medications:  Allergies as of 03/21/2021   No Known Allergies      Medication List        Accurate as of March 21, 2021  8:57 AM. If you have any questions, ask  your nurse or doctor.          STOP taking these medications    OVER THE COUNTER MEDICATION Stopped by: Hollice Espy, MD       TAKE these medications    aspirin 81 MG tablet Take 81 mg by mouth every evening.   glimepiride 2 MG tablet Commonly known as: AMARYL Take 2 mg by mouth daily with breakfast.   lisinopril 2.5 MG tablet Commonly known as: ZESTRIL Take 2.5 mg by mouth daily.   lovastatin 20 MG tablet Commonly known as: MEVACOR Take 20 mg by mouth at bedtime.   metFORMIN 1000 MG tablet Commonly known as: GLUCOPHAGE Take 1,000-1,500 mg by mouth See admin instructions. Take 1000 mg by mouth in the morning and take 1500 mg by mouth in the evening with meals   MULTIVITAMIN ADULT PO Take 1 tablet by mouth daily.   sildenafil 100 MG tablet Commonly known as: VIAGRA TAKE 1 TABLET BY MOUTH ONCE DAILY AS NEEDED FOR  ERECTILE  DYSFUNCTION        Allergies: No Known Allergies  Family History: Family History  Problem Relation Age of Onset   Prostate cancer Neg Hx    Kidney disease Neg Hx    Bladder Cancer Neg Hx     Social History:  reports that he has never smoked. He has never used smokeless tobacco. He reports current alcohol  use of about 5.0 standard drinks per week. He reports that he does not use drugs.   Physical Exam: BP (!) 224/91   Pulse 97   Ht 5\' 11"  (1.803 m)   Wt 192 lb (87.1 kg)   BMI 26.78 kg/m   Constitutional:  Alert and oriented, No acute distress. HEENT: [place] AT, moist mucus membranes.  Trachea midline, no masses. Cardiovascular: No clubbing, cyanosis, or edema. Respiratory: Normal respiratory effort, no increased work of breathing. Skin: No rashes, bruises or suspicious lesions. Neurologic: Grossly intact, no focal deficits, moving all 4 extremities. Psychiatric: Normal mood and affect.   Assessment & Plan:    BPH with obstruction/ LUTS  - Symptoms excellent   2. Hypertension - Blood pressure markedly elevated today he  states that he is nervous but otherwise asymptomatic  - Counseled him on the importance of maintaining a healthy blood pressure and how it relates to vascular function and erectile dysfunction. Would recommend he follow-up with his PCP for his blood pressure.  - If his blood pressure continues to be elevated may have to stop taking sildenafil   3.  Erectile dysfunction of vasculogenic origin Refilled sildenafil, 20 mg tablets of 200 mg, good Rx coupon was supplied   Follow-up for virtual visit in 1 year   I have reviewed the above documentation for accuracy and completeness, and I agree with the above.   Hollice Espy, MD   Gevena Mart Littlejohn,acting as a scribe for Hollice Espy, MD.,have documented all relevant documentation on the behalf of Hollice Espy, MD,as directed by  Hollice Espy, MD while in the presence of Hollice Espy, MD.   [facility] [address], [place], [place] [address] [phone]

## 2021-03-21 ENCOUNTER — Encounter: Payer: Self-pay | Admitting: Urology

## 2021-03-21 ENCOUNTER — Ambulatory Visit: Payer: Medicare HMO | Admitting: Urology

## 2021-03-21 ENCOUNTER — Other Ambulatory Visit: Payer: Self-pay

## 2021-03-21 DIAGNOSIS — N401 Enlarged prostate with lower urinary tract symptoms: Secondary | ICD-10-CM

## 2021-03-21 DIAGNOSIS — N529 Male erectile dysfunction, unspecified: Secondary | ICD-10-CM | POA: Diagnosis not present

## 2021-03-21 DIAGNOSIS — I10 Essential (primary) hypertension: Secondary | ICD-10-CM

## 2021-03-21 DIAGNOSIS — N138 Other obstructive and reflux uropathy: Secondary | ICD-10-CM

## 2021-03-21 MED ORDER — SILDENAFIL CITRATE 20 MG PO TABS: ORAL_TABLET | ORAL | 3 refills | Status: DC

## 2021-05-14 ENCOUNTER — Ambulatory Visit: Payer: Medicare HMO | Admitting: Dermatology

## 2021-06-10 ENCOUNTER — Encounter: Payer: Self-pay | Admitting: Urology

## 2021-06-11 MED ORDER — TADALAFIL 20 MG PO TABS: ORAL_TABLET | ORAL | 3 refills | Status: DC

## 2021-11-27 ENCOUNTER — Other Ambulatory Visit: Payer: Self-pay | Admitting: Urology

## 2021-11-28 ENCOUNTER — Other Ambulatory Visit: Payer: Self-pay | Admitting: Urology

## 2021-11-28 DIAGNOSIS — N529 Male erectile dysfunction, unspecified: Secondary | ICD-10-CM

## 2021-11-28 MED ORDER — TADALAFIL 20 MG PO TABS: ORAL_TABLET | ORAL | 0 refills | Status: DC

## 2022-01-28 ENCOUNTER — Other Ambulatory Visit: Payer: Self-pay | Admitting: Urology

## 2022-01-28 DIAGNOSIS — N529 Male erectile dysfunction, unspecified: Secondary | ICD-10-CM

## 2022-03-20 ENCOUNTER — Other Ambulatory Visit: Payer: Self-pay | Admitting: Urology

## 2022-03-20 DIAGNOSIS — N529 Male erectile dysfunction, unspecified: Secondary | ICD-10-CM

## 2022-03-27 ENCOUNTER — Telehealth: Payer: Medicare HMO | Admitting: Urology

## 2022-03-27 DIAGNOSIS — R351 Nocturia: Secondary | ICD-10-CM | POA: Diagnosis not present

## 2022-03-27 DIAGNOSIS — N529 Male erectile dysfunction, unspecified: Secondary | ICD-10-CM

## 2022-03-27 DIAGNOSIS — Z125 Encounter for screening for malignant neoplasm of prostate: Secondary | ICD-10-CM | POA: Diagnosis not present

## 2022-03-27 MED ORDER — TADALAFIL 20 MG PO TABS: ORAL_TABLET | ORAL | 11 refills | Status: DC

## 2022-03-27 NOTE — Progress Notes (Signed)
Virtual Visit via Video Note  I connected with Kristopher Christensen on 03/27/22 at 11:15 AM EDT by a video enabled telemedicine application and verified that I am speaking with the correct person using two identifiers.  Location: Patient: home Provider: [place] office   I discussed the limitations of evaluation and management by telemedicine and the availability of in person appointments. The patient expressed understanding and agreed to proceed.  History of Present Illness: 71 year old male with a personal history of BPH, nocturia, erectile dysfunction who returns today for routine annual follow-up.   Patient was taken into the operating room on 12/14/2018 for enucleation of the median lobe only. Pathology was benign.     He reports that he has excellent daytime urinary control.  He has good stream feels like he empties without urgency or frequency.  At nighttime, he gets up every 3-4 hours for total of 3 times.  If he exercises, he does not get up as much.  He does have sleep apnea and is not able to tolerate CPAP.  He also drinks an 8 ounce glass of water an hour or 2 before bedtime.  He does have erectile dysfunction, does well with Cialis.  Requesting refills today.  He has not had a PSA checked since 07/2019 which time is 0.8.  He reports that his PCP does not recommend PSA screening.  Observations/Objective: Doing well, no issues  Assessment and Plan:  1. Nocturia In the absence of daytime urinary symptoms, strongly suspect this is related to untreated sleep apnea  We discussed behavioral modification today including avoidance of beverages 4 hours before bedtime.   Given that exercise seems to help his nighttime urinary symptoms, strongly encouraged this especially in light of his hypertension and diabetes for overall wellbeing.  2. Erectile dysfunction, unspecified erectile dysfunction type Discussed the pathophysiology of erectile dysfunction today - tadalafil (CIALIS) 20 MG  tablet; 2 hours prior to sexual  Dispense: 30 tablet; Refill: 11  3. Prostate cancer screening We discussed the [place] preventative task force as well as AUA guidelines.  Given his relatively good health, I do think PSA screening is reasonable in this patient.  We discussed risk and benefits.  Ultimately, he is elected to have a PSA checked.   Follow Up Instructions: Prefers to continue follow with [place] virtually annually   I discussed the assessment and treatment plan with the patient. The patient was provided an opportunity to ask questions and all were answered. The patient agreed with the plan and demonstrated an understanding of the instructions.   The patient was advised to call back or seek an in-person evaluation if the symptoms worsen or if the condition fails to improve as anticipated.  I provided 12 minutes of non-face-to-face time during this encounter.   Hollice Espy, MD

## 2022-03-27 NOTE — Addendum Note (Signed)
Addended by: Amado Coe on: 03/27/2022 03:55 PM   Modules accepted: Orders

## 2022-04-01 ENCOUNTER — Other Ambulatory Visit: Payer: Medicare HMO

## 2022-04-01 DIAGNOSIS — Z125 Encounter for screening for malignant neoplasm of prostate: Secondary | ICD-10-CM

## 2022-04-01 DIAGNOSIS — N529 Male erectile dysfunction, unspecified: Secondary | ICD-10-CM

## 2022-04-01 DIAGNOSIS — R351 Nocturia: Secondary | ICD-10-CM

## 2022-04-02 LAB — PSA: Prostate Specific Ag, Serum: 1 ng/mL (ref 0.0–4.0)

## 2022-04-09 ENCOUNTER — Encounter: Payer: Self-pay | Admitting: Ophthalmology

## 2022-04-11 NOTE — Discharge Instructions (Signed)

## 2022-04-16 ENCOUNTER — Encounter
Admission: RE | Disposition: A | Discharge status: Home / Self Care | Payer: Self-pay | Source: Ambulatory Visit | Attending: Ophthalmology

## 2022-04-16 ENCOUNTER — Ambulatory Visit: Payer: Medicare HMO | Admitting: Anesthesiology

## 2022-04-16 ENCOUNTER — Encounter: Payer: Self-pay | Admitting: Ophthalmology

## 2022-04-16 ENCOUNTER — Ambulatory Visit
Admission: RE | Admit: 2022-04-16 | Discharge: 2022-04-16 | Disposition: A | Discharge status: Home / Self Care | Payer: Medicare HMO | Source: Ambulatory Visit | Attending: Ophthalmology | Admitting: Ophthalmology

## 2022-04-16 ENCOUNTER — Other Ambulatory Visit: Payer: Self-pay

## 2022-04-16 DIAGNOSIS — R7989 Other specified abnormal findings of blood chemistry: Secondary | ICD-10-CM | POA: Insufficient documentation

## 2022-04-16 DIAGNOSIS — Z79899 Other long term (current) drug therapy: Secondary | ICD-10-CM | POA: Diagnosis not present

## 2022-04-16 DIAGNOSIS — I10 Essential (primary) hypertension: Secondary | ICD-10-CM | POA: Insufficient documentation

## 2022-04-16 DIAGNOSIS — E1136 Type 2 diabetes mellitus with diabetic cataract: Secondary | ICD-10-CM | POA: Insufficient documentation

## 2022-04-16 DIAGNOSIS — Z85828 Personal history of other malignant neoplasm of skin: Secondary | ICD-10-CM | POA: Diagnosis not present

## 2022-04-16 DIAGNOSIS — H2511 Age-related nuclear cataract, right eye: Secondary | ICD-10-CM | POA: Diagnosis not present

## 2022-04-16 DIAGNOSIS — Z7984 Long term (current) use of oral hypoglycemic drugs: Secondary | ICD-10-CM | POA: Insufficient documentation

## 2022-04-16 HISTORY — DX: Essential (primary) hypertension: I10

## 2022-04-16 HISTORY — DX: Motion sickness, initial encounter: T75.3XXA

## 2022-04-16 HISTORY — PX: CATARACT EXTRACTION W/PHACO: SHX586

## 2022-04-16 LAB — GLUCOSE, CAPILLARY: Glucose-Capillary: 144 mg/dL — ABNORMAL HIGH (ref 70–99)

## 2022-04-16 SURGERY — PHACOEMULSIFICATION, CATARACT, WITH IOL INSERTION
Anesthesia: Monitor Anesthesia Care | Site: Eye | Laterality: Right

## 2022-04-16 MED ORDER — FENTANYL CITRATE (PF) 100 MCG/2ML IJ SOLN
INTRAMUSCULAR | Status: DC | PRN
  Administered 2022-04-16: 50 ug via INTRAVENOUS

## 2022-04-16 MED ORDER — SIGHTPATH DOSE#1 NA CHONDROIT SULF-NA HYALURON 40-17 MG/ML IO SOLN
INTRAOCULAR | Status: DC | PRN
  Administered 2022-04-16: 1 mL via INTRAOCULAR

## 2022-04-16 MED ORDER — SIGHTPATH DOSE#1 BSS IO SOLN
INTRAOCULAR | Status: DC | PRN
  Administered 2022-04-16: 50 mL via OPHTHALMIC

## 2022-04-16 MED ORDER — MIDAZOLAM HCL 2 MG/2ML IJ SOLN
INTRAMUSCULAR | Status: DC | PRN
  Administered 2022-04-16: 1 mg via INTRAVENOUS

## 2022-04-16 MED ORDER — TETRACAINE HCL 0.5 % OP SOLN
1.0000 [drp] | OPHTHALMIC | Status: DC | PRN
  Administered 2022-04-16 (×3): 1 [drp] via OPHTHALMIC

## 2022-04-16 MED ORDER — LACTATED RINGERS IV SOLN: INTRAVENOUS | Status: DC

## 2022-04-16 MED ORDER — ARMC OPHTHALMIC DILATING DROPS
1.0000 | OPHTHALMIC | Status: DC | PRN
  Administered 2022-04-16 (×3): 1 via OPHTHALMIC

## 2022-04-16 MED ORDER — SIGHTPATH DOSE#1 BSS IO SOLN
INTRAOCULAR | Status: DC | PRN
  Administered 2022-04-16: 15 mL via INTRAOCULAR

## 2022-04-16 MED ORDER — BRIMONIDINE TARTRATE-TIMOLOL 0.2-0.5 % OP SOLN
OPHTHALMIC | Status: DC | PRN
  Administered 2022-04-16: 1 [drp] via OPHTHALMIC

## 2022-04-16 MED ORDER — MOXIFLOXACIN HCL 0.5 % OP SOLN
OPHTHALMIC | Status: DC | PRN
  Administered 2022-04-16: .2 mL via OPHTHALMIC

## 2022-04-16 MED ORDER — SIGHTPATH DOSE#1 BSS IO SOLN
INTRAOCULAR | Status: DC | PRN
  Administered 2022-04-16: 2 mL

## 2022-04-16 SURGICAL SUPPLY — 15 items
CANNULA ANT/CHMB 27G (MISCELLANEOUS) IMPLANT
CANNULA ANT/CHMB 27GA (MISCELLANEOUS) IMPLANT
CATARACT SUITE SIGHTPATH (MISCELLANEOUS) ×1 IMPLANT
FEE CATARACT SUITE SIGHTPATH (MISCELLANEOUS) ×1 IMPLANT
GLOVE SURG ENC TEXT LTX SZ8 (GLOVE) ×1 IMPLANT
GLOVE SURG TRIUMPH 8.0 PF LTX (GLOVE) ×1 IMPLANT
LENS IOL TECNIS EYHANCE 18.5 (Intraocular Lens) IMPLANT
NDL FILTER BLUNT 18X1 1/2 (NEEDLE) ×1 IMPLANT
NEEDLE FILTER BLUNT 18X1 1/2 (NEEDLE) ×1 IMPLANT
PACK VIT ANT 23G (MISCELLANEOUS) IMPLANT
RING MALYGIN (MISCELLANEOUS) IMPLANT
SUT ETHILON 10-0 CS-B-6CS-B-6 (SUTURE)
SUTURE EHLN 10-0 CS-B-6CS-B-6 (SUTURE) IMPLANT
SYR 3ML LL SCALE MARK (SYRINGE) ×1 IMPLANT
WATER STERILE IRR 250ML POUR (IV SOLUTION) ×1 IMPLANT

## 2022-04-16 NOTE — H&P (Signed)
Kristopher Christensen   Primary Care Physician:  Dion Body, MD Ophthalmologist: Dr. George Ina  Pre-Procedure History & Physical: HPI:  Kristopher Christensen is a 71 y.o. adult here for cataract surgery.   Past Medical History:  Diagnosis Date   Cancer ([place])    Basal cell cancer of lt.lower leg   Diabetes mellitus without complication (HCC)    Elevated lipids    Enlarged prostate    Heartburn    Hypertension    Kidney stone    Motion sickness    back seat of car, boat    Past Surgical History:  Procedure Laterality Date   COLONOSCOPY N/A 10/14/2014   Procedure: COLONOSCOPY;  Surgeon: Hulen Luster, MD;  Location: [facility] ENDOSCOPY;  Service: Gastroenterology;  Laterality: N/A;   HOLEP-LASER ENUCLEATION OF THE PROSTATE WITH MORCELLATION N/A 12/14/2018   Procedure: HOLEP-LASER ENUCLEATION OF THE PROSTATE WITH MORCELLATION;  Surgeon: Hollice Espy, MD;  Location: [facility] ORS;  Service: Urology;  Laterality: N/A;   UMBILICAL HERNIA REPAIR      Prior to Admission medications   Medication Sig Start Date End Date Taking? Authorizing Provider  aspirin 81 MG tablet Take 81 mg by mouth every evening.    Yes [provider]  glimepiride (AMARYL) 2 MG tablet Take 2 mg by mouth daily with breakfast.  01/08/16  Yes [provider]  lisinopril (ZESTRIL) 20 MG tablet Take 20 mg by mouth daily.   Yes [provider]  lovastatin (MEVACOR) 20 MG tablet Take 20 mg by mouth at bedtime.   Yes [provider]  metFORMIN (GLUCOPHAGE) 1000 MG tablet Take 1,000-1,500 mg by mouth See admin instructions. Take 1000 mg by mouth in the morning and take 1500 mg by mouth in the evening with meals   Yes [provider]  Multiple Vitamins-Minerals (MULTIVITAMIN ADULT PO) Take 1 tablet by mouth daily.    Yes [provider]  tadalafil (CIALIS) 20 MG tablet 2 hours prior to sexual 03/27/22  Yes Hollice Espy, MD    Allergies as of 03/01/2022   (No Known Allergies)     Family History  Problem Relation Age of Onset   Prostate cancer Neg Hx    Kidney disease Neg Hx    Bladder Cancer Neg Hx     Social History   Socioeconomic History   Marital status: Married    Spouse name: Not on file   Number of children: Not on file   Years of education: Not on file   Highest education level: Not on file  Occupational History   Not on file  Tobacco Use   Smoking status: Never   Smokeless tobacco: Never  Vaping Use   Vaping Use: Never used  Substance and Sexual Activity   Alcohol use: Yes    Alcohol/week: 5.0 standard drinks of alcohol    Types: 5 Glasses of wine per week   Drug use: No   Sexual activity: Not on file  Other Topics Concern   Not on file  Social History Narrative   Not on file   Social Determinants of Health   Financial Resource Strain: Not on file  Food Insecurity: Not on file  Transportation Needs: Not on file  Physical Activity: Not on file  Stress: Not on file  Social Connections: Not on file  Intimate Partner Violence: Not on file    Review of Systems: See HPI, otherwise negative ROS  Physical Exam: BP (!) 163/87   Pulse (!) 58  Temp 98.3 F (36.8 C) (Temporal)   Resp 17   Ht '5\' 11"'$  (1.803 m)   Wt 84.8 kg   SpO2 98%   BMI 26.08 kg/m  General:   Alert, cooperative in NAD Head:  Normocephalic and atraumatic. Respiratory:  Normal work of breathing. Cardiovascular:  RRR  Impression/Plan: Kristopher Christensen is here for cataract surgery.  Risks, benefits, limitations, and alternatives regarding cataract surgery have been reviewed with the patient.  Questions have been answered.  All parties agreeable.   Birder Robson, MD  04/16/2022, 7:24 AM

## 2022-04-16 NOTE — Anesthesia Preprocedure Evaluation (Signed)
Anesthesia Evaluation  Patient identified by MRN, date of birth, ID band Patient awake    Reviewed: Allergy & Precautions, NPO status , Patient's Chart, lab work & pertinent test results  History of Anesthesia Complications Negative for: history of anesthetic complications  Airway Mallampati: II  TM Distance: >3 FB Neck ROM: Full    Dental  (+) Caps, Dental Advidsory Given Molar caps:   Pulmonary neg pulmonary ROS   Pulmonary exam normal breath sounds clear to auscultation       Cardiovascular hypertension, Pt. on medications (-) angina (-) Past MI and (-) Cardiac Stents (-) dysrhythmias (-) Valvular Problems/Murmurs Rhythm:Regular Rate:Normal  Increased lipids   Neuro/Psych negative neurological ROS  negative psych ROS   GI/Hepatic negative GI ROS, Neg liver ROS,,,  Endo/Other  diabetes, Well Controlled, Type 2    Renal/GU stones  negative genitourinary   Musculoskeletal negative musculoskeletal ROS (+)    Abdominal Normal abdominal exam  (+)   Peds negative pediatric ROS (+)  Hematology negative hematology ROS (+)   Anesthesia Other Findings Past Medical History: No date: Cancer ([place])     Comment:  Basal cell cancer of lt.lower leg No date: Diabetes mellitus without complication (HCC) No date: Elevated lipids No date: Enlarged prostate No date: Heartburn No date: Hypertension No date: Kidney stone No date: Motion sickness     Comment:  back seat of car, boat   Reproductive/Obstetrics negative OB ROS                             Anesthesia Physical Anesthesia Plan  ASA: 2  Anesthesia Plan: MAC   Post-op Pain Management:    Induction: Intravenous  PONV Risk Score and Plan: 1 and Midazolam and Treatment may vary due to age or medical condition  Airway Management Planned: Oral ETT  Additional Equipment:   Intra-op Plan:   Post-operative Plan: Extubation in  OR  Informed Consent: I have reviewed the patients History and Physical, chart, labs and discussed the procedure including the risks, benefits and alternatives for the proposed anesthesia with the patient or authorized representative who has indicated his/her understanding and acceptance.       Plan Discussed with: CRNA and Surgeon  Anesthesia Plan Comments:         Anesthesia Quick Evaluation

## 2022-04-16 NOTE — Op Note (Signed)
PREOPERATIVE DIAGNOSIS:  Nuclear sclerotic cataract of the right eye.   POSTOPERATIVE DIAGNOSIS:  Cataract   OPERATIVE PROCEDURE:ORPROCALL@   SURGEON:  Kristopher Robson, MD.   ANESTHESIA:  Anesthesiologist: Martha Clan, MD CRNA: Jacqualin Combes, CRNA  1.      Managed anesthesia care. 2.      0.44m of Shugarcaine was instilled in the eye following the paracentesis.   COMPLICATIONS:  None.   TECHNIQUE:   Stop and chop   DESCRIPTION OF PROCEDURE:  The patient was examined and consented in the preoperative holding area where the aforementioned topical anesthesia was applied to the right eye and then brought back to the Operating Room where the right eye was prepped and draped in the usual sterile ophthalmic fashion and a lid speculum was placed. A paracentesis was created with the side port blade and the anterior chamber was filled with viscoelastic. A near clear corneal incision was performed with the steel keratome. A continuous curvilinear capsulorrhexis was performed with a cystotome followed by the capsulorrhexis forceps. Hydrodissection and hydrodelineation were carried out with BSS on a blunt cannula. The lens was removed in a stop and chop  technique and the remaining cortical material was removed with the irrigation-aspiration handpiece. The capsular bag was inflated with viscoelastic and the Technis [id number]  lens was placed in the capsular bag without complication. The remaining viscoelastic was removed from the eye with the irrigation-aspiration handpiece. The wounds were hydrated. The anterior chamber was flushed with BSS and the eye was inflated to physiologic pressure. 0.172mof Vigamox was placed in the anterior chamber. The wounds were found to be water tight. The eye was dressed with Combigan. The patient was given protective glasses to wear throughout the day and a shield with which to sleep tonight. The patient was also given drops with which to begin a drop regimen today and  will follow-up with me in one day. Implant Name Type Inv. Item Serial No. Manufacturer Lot No. LRB No. Used Action  LENS IOL TECNIS EYHANCE 18.5 - [id number] Lens LENS IOL TECNIS EYHANCE 18.5 [id number]  Right 1 Implanted   Procedure(s) with comments: CATARACT EXTRACTION PHACO AND INTRAOCULAR LENS PLACEMENT (IOC) RIGHT DIABETIC 9.10 00:52.7 (Right) - Diabetic  Electronically signed: WiBirder Robson1/14/2023 7:52 AM

## 2022-04-16 NOTE — Anesthesia Postprocedure Evaluation (Signed)
Anesthesia Post Note  Patient: Kristopher Christensen  Procedure(s) Performed: CATARACT EXTRACTION PHACO AND INTRAOCULAR LENS PLACEMENT (IOC) RIGHT DIABETIC 9.10 00:52.7 (Right: Eye)  Patient location during evaluation: PACU Anesthesia Type: MAC Level of consciousness: awake and alert Pain management: pain level controlled Vital Signs Assessment: post-procedure vital signs reviewed and stable Respiratory status: spontaneous breathing, nonlabored ventilation, respiratory function stable and patient connected to nasal cannula oxygen Cardiovascular status: stable and blood pressure returned to baseline Postop Assessment: no apparent nausea or vomiting Anesthetic complications: no   No notable events documented.   Last Vitals:  Vitals:   04/16/22 0752 04/16/22 0756  BP: (!) 148/82 (!) 147/78  Pulse: (!) 52 (!) 46  Resp: 14 12  Temp: 36.6 C 36.6 C  SpO2: 97% 95%    Last Pain:  Vitals:   04/16/22 0756  TempSrc:   PainSc: 0-No pain                 Martha Clan

## 2022-04-16 NOTE — Transfer of Care (Signed)
Immediate Anesthesia Transfer of Care Note  Patient: Kristopher Christensen  Procedure(s) Performed: CATARACT EXTRACTION PHACO AND INTRAOCULAR LENS PLACEMENT (IOC) RIGHT DIABETIC 9.10 00:52.7 (Right: Eye)  Patient Location: PACU  Anesthesia Type: MAC  Level of Consciousness: awake, alert  and patient cooperative  Airway and Oxygen Therapy: Patient Spontanous Breathing and Patient connected to supplemental oxygen  Post-op Assessment: Post-op Vital signs reviewed, Patient's Cardiovascular Status Stable, Respiratory Function Stable, Patent Airway and No signs of Nausea or vomiting  Post-op Vital Signs: Reviewed and stable  Complications: No notable events documented.

## 2022-04-17 ENCOUNTER — Encounter: Payer: Self-pay | Admitting: Ophthalmology

## 2022-06-18 ENCOUNTER — Other Ambulatory Visit: Payer: Self-pay | Admitting: Urology

## 2022-06-25 ENCOUNTER — Other Ambulatory Visit: Payer: Self-pay | Admitting: Urology

## 2022-07-04 ENCOUNTER — Encounter: Payer: Self-pay | Admitting: Urology

## 2022-07-04 MED ORDER — SILDENAFIL CITRATE 20 MG PO TABS: ORAL_TABLET | ORAL | 11 refills | Status: DC

## 2022-07-04 NOTE — Telephone Encounter (Signed)
This is ok to refill and add back into patient's chart?

## 2022-08-12 ENCOUNTER — Ambulatory Visit
Admission: EM | Admit: 2022-08-12 | Discharge: 2022-08-12 | Disposition: A | Discharge status: Home / Self Care | Payer: Medicare HMO | Attending: Urgent Care | Admitting: Urgent Care

## 2022-08-12 DIAGNOSIS — J029 Acute pharyngitis, unspecified: Secondary | ICD-10-CM | POA: Diagnosis present

## 2022-08-12 LAB — POCT RAPID STREP A (OFFICE): Rapid Strep A Screen: NEGATIVE

## 2022-08-12 NOTE — ED Triage Notes (Signed)
Patient presents to UC for sore throat since weds. Taking OTC meds. States sore throat worsened, pain with swallowing. Concerned with strep.

## 2022-08-12 NOTE — ED Provider Notes (Signed)
Kristopher Christensen    CSN: [id number] Arrival date & time: 08/12/22  0840      History   Chief Complaint Chief Complaint  Patient presents with   Sore Throat    HPI Kristopher Christensen is a 72 y.o. adult.    Sore Throat    Presents to urgent care with sore throat.  States his symptoms have worsened since initial, endorses pain with swallowing.  Denies fever.  Denies other URI symptoms.  Using OTC medication for symptom control including TheraFlu.  Past Medical History:  Diagnosis Date   Cancer ([place])    Basal cell cancer of lt.lower leg   Diabetes mellitus without complication (HCC)    Elevated lipids    Enlarged prostate    Heartburn    Hypertension    Kidney stone    Motion sickness    back seat of car, boat    Patient Active Problem List   Diagnosis Date Noted   Pure hypercholesterolemia 07/15/2018   Vaccine counseling 08/07/2015    Past Surgical History:  Procedure Laterality Date   CATARACT EXTRACTION W/PHACO Right 04/16/2022   Procedure: CATARACT EXTRACTION PHACO AND INTRAOCULAR LENS PLACEMENT (IOC) RIGHT DIABETIC 9.10 00:52.7;  Surgeon: Birder Robson, MD;  Location: [facility];  Service: Ophthalmology;  Laterality: Right;  Diabetic   COLONOSCOPY N/A 10/14/2014   Procedure: COLONOSCOPY;  Surgeon: Hulen Luster, MD;  Location: [facility] ENDOSCOPY;  Service: Gastroenterology;  Laterality: N/A;   HOLEP-LASER ENUCLEATION OF THE PROSTATE WITH MORCELLATION N/A 12/14/2018   Procedure: HOLEP-LASER ENUCLEATION OF THE PROSTATE WITH MORCELLATION;  Surgeon: Hollice Espy, MD;  Location: [facility] ORS;  Service: Urology;  Laterality: N/A;   UMBILICAL HERNIA REPAIR      OB History   No obstetric history on file.      Home Medications    Prior to Admission medications   Medication Sig Start Date End Date Taking? Authorizing Provider  aspirin 81 MG tablet Take 81 mg by mouth every evening.     [provider]  glimepiride (AMARYL) 2 MG tablet Take 2 mg by  mouth daily with breakfast.  01/08/16   [provider]  lisinopril (ZESTRIL) 20 MG tablet Take 20 mg by mouth daily.    [provider]  lovastatin (MEVACOR) 20 MG tablet Take 20 mg by mouth at bedtime.    [provider]  metFORMIN (GLUCOPHAGE) 1000 MG tablet Take 1,000-1,500 mg by mouth See admin instructions. Take 1000 mg by mouth in the morning and take 1500 mg by mouth in the evening with meals    [provider]  Multiple Vitamins-Minerals (MULTIVITAMIN ADULT PO) Take 1 tablet by mouth daily.     [provider]  sildenafil (REVATIO) 20 MG tablet Take 1-5 tablets as needed 1 hour prior to intercourse 07/04/22   Hollice Espy, MD  tadalafil (CIALIS) 20 MG tablet 2 hours prior to sexual 03/27/22   Hollice Espy, MD    Family History Family History  Problem Relation Age of Onset   Prostate cancer Neg Hx    Kidney disease Neg Hx    Bladder Cancer Neg Hx     Social History Social History   Tobacco Use   Smoking status: Never   Smokeless tobacco: Never  Vaping Use   Vaping Use: Never used  Substance Use Topics   Alcohol use: Yes    Alcohol/week: 5.0 standard drinks of alcohol    Types: 5 Glasses of wine per week  Drug use: No     Allergies   Patient has no known allergies.   Review of Systems Review of Systems   Physical Exam Triage Vital Signs ED Triage Vitals [08/12/22 0908]  Enc Vitals Group     BP (!) 185/87     Pulse Rate 90     Resp 16     Temp 98.6 F (37 C)     Temp Source Temporal     SpO2 97 %     Weight      Height      Head Circumference      Peak Flow      Pain Score      Pain Loc      Pain Edu?      Excl. in [place]?    No data found.  Updated Vital Signs BP (!) 185/87 (BP Location: Left Arm)   Pulse 90   Temp 98.6 F (37 C) (Temporal)   Resp 16   SpO2 97%   Visual Acuity Right Eye Distance:   Left Eye Distance:   Bilateral Distance:    Right Eye Near:   Left Eye Near:    Bilateral  Near:     Physical Exam Vitals reviewed.  Constitutional:      Appearance: She is well-developed.  HENT:     Mouth/Throat:     Mouth: Mucous membranes are moist.     Pharynx: Posterior oropharyngeal erythema present. No oropharyngeal exudate.  Skin:    General: Skin is warm.  Neurological:     General: No focal deficit present.     Mental Status: She is alert and oriented to person, place, and time.  Psychiatric:        Mood and Affect: Mood normal.        Behavior: Behavior normal.      UC Treatments / Results  Labs (all labs ordered are listed, but only abnormal results are displayed) Labs Reviewed  POCT RAPID STREP A (OFFICE)    EKG   Radiology No results found.  Procedures Procedures (including critical care time)  Medications Ordered in UC Medications - No data to display  Initial Impression / Assessment and Plan / UC Course  I have reviewed the triage vital signs and the nursing notes.  Pertinent labs & imaging results that were available during my care of the patient were reviewed by me and considered in my medical decision making (see chart for details).   Patient is afebrile here without recent antipyretics. Satting well on room air. Overall is well appearing, well hydrated, without respiratory distress. Pulmonary exam is unremarkable.  Lungs CTAB without wheezing, rhonchi, rales.  Mild pharyngeal erythema without peritonsillar exudates.  Rapid strep is negative. Presumed viral pharyngitis. Will send culture to confirm diagnosis. Otherwise recommending use of OTC medication for symptom control.  Final Clinical Impressions(s) / UC Diagnoses   Final diagnoses:  None   Discharge Instructions   None    ED Prescriptions   None    PDMP not reviewed this encounter.   Rose Phi, FNP 08/12/22 0930

## 2022-08-12 NOTE — Discharge Instructions (Addendum)
Follow up here or with your primary care provider if your symptoms are worsening or not improving.     

## 2022-08-14 LAB — CULTURE, GROUP A STREP (THRC)

## 2023-04-01 ENCOUNTER — Telehealth: Payer: Medicare HMO | Admitting: Urology

## 2023-04-01 DIAGNOSIS — R351 Nocturia: Secondary | ICD-10-CM | POA: Diagnosis not present

## 2023-04-01 DIAGNOSIS — N529 Male erectile dysfunction, unspecified: Secondary | ICD-10-CM

## 2023-04-01 DIAGNOSIS — Z125 Encounter for screening for malignant neoplasm of prostate: Secondary | ICD-10-CM | POA: Diagnosis not present

## 2023-04-01 NOTE — Progress Notes (Signed)
  I,Amy L Pierron,acting as a scribe for Vanna Scotland, MD.,have documented all relevant documentation on the behalf of Vanna Scotland, MD,as directed by  Vanna Scotland, MD while in the presence of Vanna Scotland, MD.  Virtual Visit via Video Note  I connected with Delorise Shiner on 04/01/2023 at 11:15 AM EDT by a video enabled telemedicine application and verified that I am speaking with the correct person using two identifiers.  Location: Patient: Kristopher Christensen at home Provider: Dr. Vanna Scotland in [place] office   I discussed the limitations of evaluation and management by telemedicine and the availability of in person appointments. The patient expressed understanding and agreed to proceed.  History of Present Illness: 72 year-old male with a personal history of BPH, nocturia, and erectile dysfunction presents for virtually for routine annual appointment.  He also has a personal history of sleep apnea and is not able to use his CPAP.   He is managed on Cialis for his erectile dysfunction.  His most recent PSA on 04/01/2022 was 1.0.  He reports a good urinary stream. He has nocturia a few nights a week and gets up every 2 hours usually. He drinks a lot of water and doesn't have a cut off time in the evening.   He is not on any BPH medications and is reasonably satisfied with the urinary symptoms.   He prefers Sildenafil. Cialis causes indigestion.     Observations/Objective: He appears well.   Assessment and Plan:  1. Nocturia  - Only residual urinary symptom. Mentioned it will likely improve with behavioral changes of avoiding liquids before bedtime. It would also likely improve if he was able to tolerate his CPAP machine, but he is not able to. Overall, the bother is minimal.  2. Erectile Dysfunction  - Takes Sildenafil as needed. No longer wants to use Cialis. -Prefers to continue to follow with urology, will see annually  3. Prostate cancer screening  - We discussed  the [place] preventative task force as well as AUA guidelines. Given his relatively good health, I do think PSA screening is reasonable in this patient up to age 60. We discussed risk and benefits. Ultimately, he is elected to have a PSA checked.   - PSA order placed today and awaiting results. He is fine to continue virtual visits here annually.  I have reviewed the above documentation for accuracy and completeness, and I agree with the above.   Vanna Scotland, MD   Follow Up Instructions: I discussed the assessment and treatment plan with the patient. The patient was provided an opportunity to ask questions and all were answered. The patient agreed with the plan and demonstrated an understanding of the instructions.   The patient was advised to call back or seek an in-person evaluation if the symptoms worsen or if the condition fails to improve as anticipated.  I provided 10 minutes of non-face-to-face time during this encounter.

## 2023-04-24 ENCOUNTER — Other Ambulatory Visit: Payer: Medicare HMO

## 2023-04-24 DIAGNOSIS — R351 Nocturia: Secondary | ICD-10-CM

## 2023-04-24 DIAGNOSIS — Z125 Encounter for screening for malignant neoplasm of prostate: Secondary | ICD-10-CM

## 2023-04-25 LAB — PSA: Prostate Specific Ag, Serum: 1.3 ng/mL (ref 0.0–4.0)

## 2023-07-15 ENCOUNTER — Other Ambulatory Visit: Payer: Self-pay | Admitting: Urology

## 2023-11-02 ENCOUNTER — Other Ambulatory Visit: Payer: Self-pay | Admitting: Urology

## 2023-11-02 DIAGNOSIS — N529 Male erectile dysfunction, unspecified: Secondary | ICD-10-CM

## 2024-01-28 ENCOUNTER — Encounter: Payer: Self-pay | Admitting: Urology

## 2024-02-11 ENCOUNTER — Observation Stay

## 2024-02-11 ENCOUNTER — Encounter: Payer: Self-pay | Admitting: Emergency Medicine

## 2024-02-11 ENCOUNTER — Observation Stay
Admission: EM | Admit: 2024-02-11 | Discharge: 2024-02-13 | Disposition: A | Discharge status: Home / Self Care | Attending: Obstetrics and Gynecology | Admitting: Obstetrics and Gynecology

## 2024-02-11 ENCOUNTER — Other Ambulatory Visit: Payer: Self-pay

## 2024-02-11 ENCOUNTER — Observation Stay
Admit: 2024-02-11 | Discharge: 2024-02-11 | Disposition: A | Discharge status: Home / Self Care | Attending: Hospitalist | Admitting: Hospitalist

## 2024-02-11 ENCOUNTER — Emergency Department

## 2024-02-11 DIAGNOSIS — E119 Type 2 diabetes mellitus without complications: Secondary | ICD-10-CM | POA: Diagnosis not present

## 2024-02-11 DIAGNOSIS — R29810 Facial weakness: Principal | ICD-10-CM

## 2024-02-11 DIAGNOSIS — Z79899 Other long term (current) drug therapy: Secondary | ICD-10-CM | POA: Diagnosis not present

## 2024-02-11 DIAGNOSIS — E7800 Pure hypercholesterolemia, unspecified: Secondary | ICD-10-CM | POA: Diagnosis not present

## 2024-02-11 DIAGNOSIS — R4781 Slurred speech: Secondary | ICD-10-CM

## 2024-02-11 DIAGNOSIS — E785 Hyperlipidemia, unspecified: Secondary | ICD-10-CM | POA: Insufficient documentation

## 2024-02-11 DIAGNOSIS — R2689 Other abnormalities of gait and mobility: Secondary | ICD-10-CM | POA: Insufficient documentation

## 2024-02-11 DIAGNOSIS — I639 Cerebral infarction, unspecified: Secondary | ICD-10-CM

## 2024-02-11 DIAGNOSIS — R2681 Unsteadiness on feet: Secondary | ICD-10-CM | POA: Diagnosis not present

## 2024-02-11 DIAGNOSIS — I6389 Other cerebral infarction: Principal | ICD-10-CM | POA: Insufficient documentation

## 2024-02-11 DIAGNOSIS — G459 Transient cerebral ischemic attack, unspecified: Secondary | ICD-10-CM | POA: Diagnosis not present

## 2024-02-11 DIAGNOSIS — Z85828 Personal history of other malignant neoplasm of skin: Secondary | ICD-10-CM | POA: Diagnosis not present

## 2024-02-11 DIAGNOSIS — Z7984 Long term (current) use of oral hypoglycemic drugs: Secondary | ICD-10-CM | POA: Insufficient documentation

## 2024-02-11 DIAGNOSIS — I10 Essential (primary) hypertension: Secondary | ICD-10-CM | POA: Diagnosis not present

## 2024-02-11 DIAGNOSIS — R269 Unspecified abnormalities of gait and mobility: Secondary | ICD-10-CM | POA: Diagnosis not present

## 2024-02-11 DIAGNOSIS — Z7982 Long term (current) use of aspirin: Secondary | ICD-10-CM | POA: Diagnosis not present

## 2024-02-11 LAB — COMPREHENSIVE METABOLIC PANEL WITH GFR
ALT: 21 U/L (ref 0–44)
ALT: 19 U/L (ref 0–44)
AST: 29 U/L (ref 15–41)
AST: 21 U/L (ref 15–41)
Albumin: 4.2 g/dL (ref 3.5–5.0)
Albumin: 4.2 g/dL (ref 3.5–5.0)
Alkaline Phosphatase: 70 U/L (ref 38–126)
Alkaline Phosphatase: 65 U/L (ref 38–126)
Anion gap: 11 (ref 5–15)
Anion gap: 9 (ref 5–15)
BUN: 15 mg/dL (ref 8–23)
BUN: 15 mg/dL (ref 8–23)
CO2: 24 mmol/L (ref 22–32)
CO2: 25 mmol/L (ref 22–32)
Calcium: 9.1 mg/dL (ref 8.9–10.3)
Calcium: 8.9 mg/dL (ref 8.9–10.3)
Chloride: 104 mmol/L (ref 98–111)
Chloride: 107 mmol/L (ref 98–111)
Creatinine, Ser: 1.07 mg/dL (ref 0.61–1.24)
Creatinine, Ser: 1.13 mg/dL (ref 0.61–1.24)
GFR, Estimated: >60 mL/min (ref >60)
GFR, Estimated: >60 mL/min (ref >60)
Glucose, Bld: 237 mg/dL — ABNORMAL HIGH (ref 70–99)
Glucose, Bld: 138 mg/dL — ABNORMAL HIGH (ref 70–99)
Potassium: 3.8 mmol/L (ref 3.5–5.1)
Potassium: 3.8 mmol/L (ref 3.5–5.1)
Sodium: 139 mmol/L (ref 135–145)
Sodium: 141 mmol/L (ref 135–145)
Total Bilirubin: 0.7 mg/dL (ref 0.0–1.2)
Total Bilirubin: 0.7 mg/dL (ref 0.0–1.2)
Total Protein: 6.7 g/dL (ref 6.5–8.1)
Total Protein: 6.7 g/dL (ref 6.5–8.1)

## 2024-02-11 LAB — CBC
HCT: 38.4 % — ABNORMAL LOW (ref 39.0–52.0)
HCT: 39.3 % (ref 39.0–52.0)
Hemoglobin: 13.6 g/dL (ref 13.0–17.0)
Hemoglobin: 13.5 g/dL (ref 13.0–17.0)
MCH: 30.8 pg (ref 26.0–34.0)
MCH: 29.9 pg (ref 26.0–34.0)
MCHC: 35.4 g/dL (ref 30.0–36.0)
MCHC: 34.4 g/dL (ref 30.0–36.0)
MCV: 86.9 fL (ref 80.0–100.0)
MCV: 87.1 fL (ref 80.0–100.0)
Platelets: 215 K/uL (ref 150–400)
Platelets: 207 K/uL (ref 150–400)
RBC: 4.42 MIL/uL (ref 4.22–5.81)
RBC: 4.51 MIL/uL (ref 4.22–5.81)
RDW: 12.9 % (ref 11.5–15.5)
RDW: 13 % (ref 11.5–15.5)
WBC: 5.7 K/uL (ref 4.0–10.5)
WBC: 6.3 K/uL (ref 4.0–10.5)
nRBC: 0 % (ref 0.0–0.2)
nRBC: 0 % (ref 0.0–0.2)

## 2024-02-11 LAB — CREATININE, SERUM
Creatinine, Ser: 1.08 mg/dL (ref 0.61–1.24)
GFR, Estimated: >60 mL/min (ref >60)

## 2024-02-11 LAB — APTT: aPTT: 29 s (ref 24–36)

## 2024-02-11 LAB — DIFFERENTIAL
Abs Immature Granulocytes: 0.02 K/uL (ref 0.00–0.07)
Basophils Absolute: 0 K/uL (ref 0.0–0.1)
Basophils Relative: 1 %
Eosinophils Absolute: 0.1 K/uL (ref 0.0–0.5)
Eosinophils Relative: 2 %
Immature Granulocytes: 0 %
Lymphocytes Relative: 27 %
Lymphs Abs: 1.5 K/uL (ref 0.7–4.0)
Monocytes Absolute: 0.5 K/uL (ref 0.1–1.0)
Monocytes Relative: 9 %
Neutro Abs: 3.5 K/uL (ref 1.7–7.7)
Neutrophils Relative %: 61 %

## 2024-02-11 LAB — GLUCOSE, CAPILLARY
Glucose-Capillary: 129 mg/dL — ABNORMAL HIGH (ref 70–99)
Glucose-Capillary: 227 mg/dL — ABNORMAL HIGH (ref 70–99)

## 2024-02-11 LAB — PROTIME-INR
INR: 1 (ref 0.8–1.2)
Prothrombin Time: 13.4 s (ref 11.4–15.2)

## 2024-02-11 LAB — ETHANOL: Alcohol, Ethyl (B): <15 mg/dL (ref <15)

## 2024-02-11 MED ORDER — LISINOPRIL 10 MG PO TABS
30.0000 mg | ORAL_TABLET | Freq: Every day | ORAL | Status: DC
Start: 2024-02-11 — End: 2024-02-12
  Administered 2024-02-11: 30 mg via ORAL
  Filled 2024-02-11: qty 3

## 2024-02-11 MED ORDER — INSULIN ASPART 100 UNIT/ML IJ SOLN
0.0000 [IU] | Freq: Every day | INTRAMUSCULAR | Status: DC
  Administered 2024-02-11: 2 [IU] via SUBCUTANEOUS
  Administered 2024-02-12: 3 [IU] via SUBCUTANEOUS
  Filled 2024-02-11 (×2): qty 1

## 2024-02-11 MED ORDER — ACETAMINOPHEN 160 MG/5ML PO SOLN: 650.0000 mg | ORAL | Status: DC | PRN

## 2024-02-11 MED ORDER — SENNOSIDES-DOCUSATE SODIUM 8.6-50 MG PO TABS: 1.0000 | ORAL_TABLET | Freq: Every evening | ORAL | Status: DC | PRN

## 2024-02-11 MED ORDER — PRAVASTATIN SODIUM 20 MG PO TABS
10.0000 mg | ORAL_TABLET | Freq: Every day | ORAL | Status: DC
  Administered 2024-02-11: 10 mg via ORAL
  Filled 2024-02-11: qty 1

## 2024-02-11 MED ORDER — INSULIN ASPART 100 UNIT/ML IJ SOLN
0.0000 [IU] | Freq: Three times a day (TID) | INTRAMUSCULAR | Status: DC
  Administered 2024-02-11: 1 [IU] via SUBCUTANEOUS
  Administered 2024-02-12: 2 [IU] via SUBCUTANEOUS
  Administered 2024-02-12: 1 [IU] via SUBCUTANEOUS
  Administered 2024-02-12: 3 [IU] via SUBCUTANEOUS
  Administered 2024-02-13: 1 [IU] via SUBCUTANEOUS
  Filled 2024-02-11 (×5): qty 1

## 2024-02-11 MED ORDER — ACETAMINOPHEN 650 MG RE SUPP: 650.0000 mg | RECTAL | Status: DC | PRN

## 2024-02-11 MED ORDER — ENOXAPARIN SODIUM 40 MG/0.4ML IJ SOSY
40.0000 mg | PREFILLED_SYRINGE | INTRAMUSCULAR | Status: DC
Start: 2024-02-11 — End: 2024-02-13
  Administered 2024-02-11 – 2024-02-12 (×2): 40 mg via SUBCUTANEOUS
  Filled 2024-02-11 (×2): qty 0.4

## 2024-02-11 MED ORDER — SODIUM CHLORIDE 0.9 % IV SOLN: INTRAVENOUS | Status: AC

## 2024-02-11 MED ORDER — ASPIRIN 81 MG PO TBEC
81.0000 mg | DELAYED_RELEASE_TABLET | Freq: Every evening | ORAL | Status: DC
  Administered 2024-02-11 – 2024-02-12 (×2): 81 mg via ORAL
  Filled 2024-02-11 (×2): qty 1

## 2024-02-11 MED ORDER — STROKE: EARLY STAGES OF RECOVERY BOOK: Freq: Once | Status: AC

## 2024-02-11 MED ORDER — ACETAMINOPHEN 325 MG PO TABS: 650.0000 mg | ORAL_TABLET | ORAL | Status: DC | PRN

## 2024-02-11 NOTE — ED Notes (Signed)
 Pt in bed, pt had slight dry cough after drinking water, pt states that he thinks it because he wasn't sitting up enough, pt would like to try swallow screen again.  Pt awake and oriented. Re administered swallow screen, pt passed

## 2024-02-11 NOTE — Progress Notes (Signed)
 Patient's BP 177/85. Dr. Paudel notified.

## 2024-02-11 NOTE — Progress Notes (Signed)
 02/11/2024- 3 min

## 2024-02-11 NOTE — ED Triage Notes (Signed)
 Patient to ED via POV for slurred speech and dizziness. States he is having difficulty with coordination. LWK 1200 yesterday. Wife reports a facial droop yesterday. No weakness or numbness noted.

## 2024-02-11 NOTE — H&P (Signed)
 History and Physical    Kristopher Christensen FMW:[id number] DOB: 01/17/1951 DOA: 02/11/2024  DOS: the patient was seen and examined on 02/11/2024  PCP: Alla Amis, MD   Patient coming from: Home  I have personally briefly reviewed patient's old medical records in [facility]  Chief Complaint: Slurred speech and unstable gait  HPI: Kristopher Christensen is a pleasant 73 y.o. adult with medical history significant for DM, HTN, HLD who came into the ED at [facility] for evaluation of slurred speech and gait instability.  Patient stated that yesterday afternoon he noticed unstable gait when he was trying to walk.  He does not typically require any assistace but yesterday he was weak.  His wife noticed that he was slurred and possible facial weakness.  He required help to walk around.  Patient's wife stated that he did not want to come to the hospital yesterday.  Brought in to the hospital today.  Patient denies any history of prior stroke, denies any numbness or tingling or focal weaknesses.  Patient denies any fever chills nausea vomiting abdominal pain chest pain shortness of breath or palpitations.  ED Course: Upon arrival to the ED, patient is found to be hypertensive around 182/75, EKG showed bradycardia at 59 bpm no ST elevation, CT head showed no acute bleeding or infarct but multiple infarcts of possible primary stroke.  Hospitalist service was consulted for evaluation for admission.  Review of Systems:  ROS  All other systems negative except as noted in the HPI.  Past Medical History:  Diagnosis Date   Cancer (HCC)    Basal cell cancer of lt.lower leg   Diabetes mellitus without complication (HCC)    Elevated lipids    Enlarged prostate    Heartburn    Hypertension    Kidney stone    Motion sickness    back seat of car, boat    Past Surgical History:  Procedure Laterality Date   CATARACT EXTRACTION W/PHACO Right 04/16/2022   Procedure: CATARACT EXTRACTION PHACO AND INTRAOCULAR  LENS PLACEMENT (IOC) RIGHT DIABETIC 9.10 00:52.7;  Surgeon: Jaye Fallow, MD;  Location: [facility];  Service: Ophthalmology;  Laterality: Right;  Diabetic   COLONOSCOPY N/A 10/14/2014   Procedure: COLONOSCOPY;  Surgeon: Deward CINDERELLA Piedmont, MD;  Location: [facility]COPY;  Service: Gastroenterology;  Laterality: N/A;   HOLEP-LASER ENUCLEATION OF THE PROSTATE WITH MORCELLATION N/A 12/14/2018   Procedure: HOLEP-LASER ENUCLEATION OF THE PROSTATE WITH MORCELLATION;  Surgeon: Penne Knee, MD;  Location: [facility] ORS;  Service: Urology;  Laterality: N/A;   UMBILICAL HERNIA REPAIR       reports that she has never smoked. She has never used smokeless tobacco. She reports current alcohol use of about 5.0 standard drinks of alcohol per week. She reports that she does not use drugs.  No Known Allergies  Family History  Problem Relation Age of Onset   Prostate cancer Neg Hx    Kidney disease Neg Hx    Bladder Cancer Neg Hx     Prior to Admission medications   Medication Sig Start Date End Date Taking? Authorizing Provider  aspirin  81 MG tablet Take 81 mg by mouth every evening.     [provider]  glimepiride (AMARYL) 2 MG tablet Take 2 mg by mouth daily with breakfast.  01/08/16   [provider]  lisinopril  (ZESTRIL ) 20 MG tablet Take 20 mg by mouth daily.    [provider]  lovastatin (MEVACOR) 20 MG tablet Take 20 mg by  mouth at bedtime.    [provider]  metFORMIN (GLUCOPHAGE) 1000 MG tablet Take 1,000-1,500 mg by mouth See admin instructions. Take 1000 mg by mouth in the morning and take 1500 mg by mouth in the evening with meals    [provider]  Multiple Vitamins-Minerals (MULTIVITAMIN ADULT PO) Take 1 tablet by mouth daily.     [provider]  sildenafil  (REVATIO ) 20 MG tablet TAKE 1 TO 5 TABLETS BY MOUTH AS NEEDED 1  HOUR  PRIOR  TO  INTERCOURSE 07/15/23   Penne Knee, MD    Physical Exam: Vitals:   02/11/24 1321 02/11/24  1332 02/11/24 1443 02/11/24 1450  BP: (!) 187/93 (!) 176/95 (!) 213/101 (!) 175/97  Pulse: (!) 56 67 70   Resp: 18 17 18    Temp:   98.1 F (36.7 C)   TempSrc:   Oral   SpO2: 100% 100% 100%   Weight:      Height:        Physical Exam   Constitutional: Alert, awake, calm, comfortable HEENT: Neck supple Respiratory: Clear to auscultation B/L, no wheezing, no rales.  Cardiovascular: Regular rate and rhythm, no murmurs / rubs / gallops. No extremity edema. 2+ pedal pulses. No carotid bruits.  Abdomen: Soft, no tenderness, Bowel sounds positive.  Musculoskeletal: no clubbing / cyanosis. Good ROM, no contractures. Normal muscle tone.  Skin: no rashes, lesions, ulcers. Neurologic: CN 2-12 grossly intact. Sensation intact, No focal deficit identified, unstable gait psychiatric: Alert and oriented x 3. Normal mood.    Labs on Admission: I have personally reviewed following labs and imaging studies  CBC: Recent Labs  Lab 02/11/24 1053 02/11/24 1423  WBC 5.7 6.3  NEUTROABS 3.5  --   HGB 13.6 13.5  HCT 38.4* 39.3  MCV 86.9 87.1  PLT 215 207   Basic Metabolic Panel: Recent Labs  Lab 02/11/24 1053 02/11/24 1423  NA 139  --   K 3.8  --   CL 104  --   CO2 24  --   GLUCOSE 237*  --   BUN 15  --   CREATININE 1.07 1.08  CALCIUM  9.1  --    GFR: Estimated Creatinine Clearance (by C-G formula based on SCr of 1.08 mg/dL) Male: 48.0 mL/min Male: 64.9 mL/min Liver Function Tests: Recent Labs  Lab 02/11/24 1053  AST 29  ALT 21  ALKPHOS 70  BILITOT 0.7  PROT 6.7  ALBUMIN 4.2   No results for input(s): LIPASE, AMYLASE in the last 168 hours. No results for input(s): AMMONIA in the last 168 hours. Coagulation Profile: Recent Labs  Lab 02/11/24 1053  INR 1.0   Cardiac Enzymes: No results for input(s): CKTOTAL, CKMB, CKMBINDEX, TROPONINI, TROPONINIHS in the last 168 hours. BNP (last 3 results) No results for input(s): BNP in the last 8760  hours. HbA1C: No results for input(s): HGBA1C in the last 72 hours. CBG: No results for input(s): GLUCAP in the last 168 hours. Lipid Profile: No results for input(s): CHOL, HDL, LDLCALC, TRIG, CHOLHDL, LDLDIRECT in the last 72 hours. Thyroid Function Tests: No results for input(s): TSH, T4TOTAL, FREET4, T3FREE, THYROIDAB in the last 72 hours. Anemia Panel: No results for input(s): VITAMINB12, FOLATE, FERRITIN, TIBC, IRON, RETICCTPCT in the last 72 hours. Urine analysis:    Component Value Date/Time   APPEARANCEUR Clear 12/03/2018 1000   GLUCOSEU Trace (A) 12/03/2018 1000   BILIRUBINUR Negative 12/03/2018 1000   PROTEINUR Negative 12/03/2018 1000   NITRITE Negative 12/03/2018 1000  LEUKOCYTESUR Negative 12/03/2018 1000    Radiological Exams on Admission: I have personally reviewed images CT HEAD WO CONTRAST Result Date: 02/11/2024 EXAM: CT HEAD WITHOUT CONTRAST 02/11/2024 11:23:31 AM TECHNIQUE: CT of the head was performed without the administration of intravenous contrast. Automated exposure control, iterative reconstruction, and/or weight based adjustment of the mA/kV was utilized to reduce the radiation dose to as low as reasonably achievable. COMPARISON: None available. CLINICAL HISTORY: Neuro deficit, acute, stroke suspected. Slurred speech and dizziness. States he is having difficulty with coordination. LWK 1200 yesterday. Wife reports a facial droop yesterday. FINDINGS: BRAIN AND VENTRICLES: Nonspecific hypoattenuation in the periventricular and subcortical white matter, most likely representing chronic small vessel disease. 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 No acute hemorrhage. No evidence of acute infarct. No hydrocephalus. No extra-axial collection. No mass effect or midline shift. ORBITS: No acute abnormality. SINUSES: No  acute abnormality. SOFT TISSUES AND SKULL: No acute soft tissue abnormality. No skull fracture. Atherosclerosis of the carotid siphons. Right lens replacement. IMPRESSION: 1. No acute intracranial abnormality. 2. Mild chronic small vessel disease. 3. 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 Remote infarct in the left corona radiata. Electronically signed by: Donnice Mania MD 02/11/2024 11:47 AM EDT RP Workstation: [id number]    EKG: My personal interpretation of EKG shows:     Assessment/Plan Principal Problem:   TIA (transient ischemic attack) Active Problems:   Pure hypercholesterolemia   Controlled type 2 diabetes mellitus without complication, without long-term current use of insulin  (HCC)   HLD (hyperlipidemia)    Assessment and Plan: 73 year old male W/PMH of HTN, HLD and diabetes who was brought in for unstable gait and slurred speech.  1.  Unstable gait/slurred speech/possible acute stroke - He will be placed in observation - He will be placed in the stroke TIA protocol - Initial CT scan showed possible chronic/acute/subacute stroke - He will be placed on aspirin  and statin. - Neurology has been consulted for evaluation.  2.  DM - He will be placed in insulin  sliding scale - Glimepiride and metformin will be held  3.  HTN/HLD - Continue statin - Continue lisinopril , hold chlorthalidone       DVT prophylaxis: Lovenox  Code Status: Full Code Family Communication: Wife was at bedside and explained to her Disposition Plan: Home Consults called: Neurology Admission status: Observation, Telemetry bed   Nena Rebel, MD [facility] 02/11/2024, 3:09 PM

## 2024-02-11 NOTE — Plan of Care (Signed)
  Problem: Education: Goal: Knowledge of disease or condition will improve Outcome: Progressing Goal: Knowledge of secondary prevention will improve (MUST DOCUMENT ALL) Outcome: Progressing Goal: Knowledge of patient specific risk factors will improve (DELETE if not current risk factor) Outcome: Progressing   Problem: Ischemic Stroke/TIA Tissue Perfusion: Goal: Complications of ischemic stroke/TIA will be minimized Outcome: Progressing   Problem: Coping: Goal: Will verbalize positive feelings about self Outcome: Progressing Goal: Will identify appropriate support needs Outcome: Progressing   Problem: Health Behavior/Discharge Planning: Goal: Ability to manage health-related needs will improve Outcome: Progressing Goal: Goals will be collaboratively established with patient/family Outcome: Progressing   Problem: Self-Care: Goal: Ability to participate in self-care as condition permits will improve Outcome: Progressing Goal: Verbalization of feelings and concerns over difficulty with self-care will improve Outcome: Progressing Goal: Ability to communicate needs accurately will improve Outcome: Progressing   Problem: Nutrition: Goal: Risk of aspiration will decrease Outcome: Progressing Goal: Dietary intake will improve Outcome: Progressing   Problem: Education: Goal: Knowledge of General Education information will improve Description: Including pain rating scale, medication(s)/side effects and non-pharmacologic comfort measures Outcome: Progressing   Problem: Health Behavior/Discharge Planning: Goal: Ability to manage health-related needs will improve Outcome: Progressing   Problem: Clinical Measurements: Goal: Ability to maintain clinical measurements within normal limits will improve Outcome: Progressing Goal: Will remain free from infection Outcome: Progressing Goal: Diagnostic test results will improve Outcome: Progressing

## 2024-02-11 NOTE — ED Provider Notes (Signed)
 [facility] Provider Note    Event Date/Time   First MD Initiated Contact with Patient 02/11/24 1252     (approximate)   History   Aphasia   HPI  Kristopher Christensen is a 73 year old male with history of hypertension, T2DM presenting to the ER for evaluation of slurred speech and gait instability.  Yesterday afternoon, patient noticed that when he was walking his gait was more unsteady than his typical.  Does not typically require any assist devices.  His wife also noticed that he had slurred speech with possible facial weakness.  No known history of prior CVA.  No numbness, tingling, focal weakness.  Unsure exact last known well.      Physical Exam   Triage Vital Signs: ED Triage Vitals  Encounter Vitals Group     BP 02/11/24 1048 (!) 182/75     Girls Systolic BP Percentile --      Girls Diastolic BP Percentile --      Boys Systolic BP Percentile --      Boys Diastolic BP Percentile --      Pulse Rate 02/11/24 1048 60     Resp 02/11/24 1048 18     Temp 02/11/24 1048 97.9 F (36.6 C)     Temp Source 02/11/24 1048 Oral     SpO2 02/11/24 1048 97 %     Weight 02/11/24 1052 180 lb (81.6 kg)     Height 02/11/24 1052 5' 11 (1.803 m)     Head Circumference --      Peak Flow --      Pain Score 02/11/24 1052 0     Pain Loc --      Pain Education --      Exclude from Growth Chart --     Most recent vital signs: Vitals:   02/11/24 1450 02/11/24 1518  BP: (!) 175/97 (!) 177/85  Pulse:  (!) 59  Resp:  16  Temp:  97.9 F (36.6 C)  SpO2:  100%     General: Awake, interactive  CV:  Regular rate, good peripheral perfusion.  Resp:  Unlabored respirations.  Abd:  Nondistended.  Neuro:  Keenly aware, correctly answers month and age, able to blink eyes and squeeze hands, normal horizontal extraocular movements, no visual field loss, facial droop noted, no forehead involvement, no arm or leg motor drift, no limb ataxia, normal sensation, no aphasia, mild  dysarthria noted, no inattention, hesitant gait with instability, does not have clear preference to lean to one side NIH 2    ED Results / Procedures / Treatments   Labs (all labs ordered are listed, but only abnormal results are displayed) Labs Reviewed  CBC - Abnormal; Notable for the following components:      Result Value   HCT 38.4 (*)    All other components within normal limits  COMPREHENSIVE METABOLIC PANEL WITH GFR - Abnormal; Notable for the following components:   Glucose, Bld 237 (*)    All other components within normal limits  PROTIME-INR  APTT  DIFFERENTIAL  ETHANOL  CBC  CREATININE, SERUM  HEMOGLOBIN A1C  COMPREHENSIVE METABOLIC PANEL WITH GFR     EKG EKG independently reviewed and interpreted by myself demonstrates:  EKG demonstrate sinus bradycardia at a rate of 59, PR 152, QRS 132, QTc 441, no acute ST changes  RADIOLOGY Imaging independently reviewed and interpreted by myself demonstrates:  CT head without acute bleed, multiple prior infarcts noted by radiology  Formal Radiology  Read:  CT HEAD WO CONTRAST Result Date: 02/11/2024 EXAM: CT HEAD WITHOUT CONTRAST 02/11/2024 11:23:31 AM TECHNIQUE: CT of the head was performed without the administration of intravenous contrast. Automated exposure control, iterative reconstruction, and/or weight based adjustment of the mA/kV was utilized to reduce the radiation dose to as low as reasonably achievable. COMPARISON: None available. CLINICAL HISTORY: Neuro deficit, acute, stroke suspected. Slurred speech and dizziness. States he is having difficulty with coordination. LWK 1200 yesterday. Wife reports a facial droop yesterday. FINDINGS: BRAIN AND VENTRICLES: Nonspecific hypoattenuation in the periventricular and subcortical white matter, most likely representing chronic small vessel disease. 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 No acute hemorrhage. No evidence of acute infarct. No hydrocephalus. No extra-axial collection. No mass effect or midline shift. ORBITS: No acute abnormality. SINUSES: No acute abnormality. SOFT TISSUES AND SKULL: No acute soft tissue abnormality. No skull fracture. Atherosclerosis of the carotid siphons. Right lens replacement. IMPRESSION: 1. No acute intracranial abnormality. 2. Mild chronic small vessel disease. 3. 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 Remote infarct in the left corona radiata. Electronically signed by: Donnice Mania MD 02/11/2024 11:47 AM EDT RP Workstation: [id number]    PROCEDURES:  Critical Care performed: No  Procedures   MEDICATIONS ORDERED IN ED: Medications   stroke: early stages of recovery book (has no administration in time range)  0.9 %  sodium chloride  infusion ( Intravenous New Bag/Given 02/11/24 1423)  acetaminophen  (TYLENOL ) tablet 650 mg (has no administration in time range)    Or  acetaminophen  (TYLENOL ) 160 MG/5ML solution 650 mg (has no administration in time range)    Or  acetaminophen  (TYLENOL ) suppository 650 mg (has no administration in time range)  senna-docusate (Senokot-S) tablet 1 tablet (has no administration in time range)  enoxaparin  (LOVENOX ) injection 40 mg (has no administration in time range)  aspirin  EC tablet 81 mg (has no administration in time range)  lisinopril  (ZESTRIL ) tablet 30 mg (has no administration in time range)  pravastatin  (PRAVACHOL ) tablet 10 mg (has no administration in time range)  insulin  aspart (novoLOG ) injection 0-9 Units (has no administration in time range)  insulin  aspart (novoLOG ) injection 0-5 Units (has no administration in time range)     IMPRESSION / MDM / ASSESSMENT AND PLAN / ED COURSE  I reviewed the triage vital signs and the nursing notes.  Differential diagnosis includes, but is not limited to, CVA, TIA,  intracranial bleed, electrolyte abnormality  Patient's presentation is most consistent with acute presentation with potential threat to life or bodily function.  73 year old male presenting with facial droop, slurred speech, gait instability.  Clinical history concerning for CVA.  Presents outside the thrombolytic window without evidence of large vessel occlusion.  No indication for code stroke activation.  CT head demonstrates prior infarcts but no acute bleed or acute infarct.  Reassuring CBC, CMP.  Normal coags.  Given concern for CVA, do think patient is appropriate for admission.  Will reach out to hospitalist team.  Clinical Course as of 02/11/24 1546  Wed Feb 11, 2024  1347 Case discussed with hospitalist team.  They will evaluate for anticipated admission. [NR]    Clinical Course User Index [NR] Levander Slate, MD   Case discussed with hospitalist team.  They will evaluate for anticipated admission.  FINAL CLINICAL IMPRESSION(S) / ED DIAGNOSES   Final diagnoses:  Facial droop  Slurred speech  Gait instability     Rx / DC Orders   ED Discharge Orders     None        Note:  This document was prepared using Dragon voice recognition software and may include unintentional dictation errors.   Levander Slate, MD 02/11/24 [phone]

## 2024-02-11 NOTE — ED Notes (Signed)
 Pt in bed, pt denies pain, pt has no requests at this time, pt awaits transport for admission.

## 2024-02-12 ENCOUNTER — Other Ambulatory Visit: Payer: Self-pay | Admitting: Cardiovascular Disease

## 2024-02-12 ENCOUNTER — Observation Stay

## 2024-02-12 DIAGNOSIS — I639 Cerebral infarction, unspecified: Secondary | ICD-10-CM

## 2024-02-12 DIAGNOSIS — R29701 NIHSS score 1: Secondary | ICD-10-CM

## 2024-02-12 DIAGNOSIS — E785 Hyperlipidemia, unspecified: Secondary | ICD-10-CM | POA: Diagnosis not present

## 2024-02-12 DIAGNOSIS — I10 Essential (primary) hypertension: Secondary | ICD-10-CM

## 2024-02-12 DIAGNOSIS — E119 Type 2 diabetes mellitus without complications: Secondary | ICD-10-CM

## 2024-02-12 LAB — MAGNESIUM: Magnesium: 2.1 mg/dL (ref 1.7–2.4)

## 2024-02-12 LAB — CBC
HCT: 37.5 % — ABNORMAL LOW (ref 39.0–52.0)
Hemoglobin: 13.2 g/dL (ref 13.0–17.0)
MCH: 30.5 pg (ref 26.0–34.0)
MCHC: 35.2 g/dL (ref 30.0–36.0)
MCV: 86.6 fL (ref 80.0–100.0)
Platelets: 201 K/uL (ref 150–400)
RBC: 4.33 MIL/uL (ref 4.22–5.81)
RDW: 12.9 % (ref 11.5–15.5)
WBC: 4.8 K/uL (ref 4.0–10.5)
nRBC: 0 % (ref 0.0–0.2)

## 2024-02-12 LAB — ECHOCARDIOGRAM COMPLETE
Area-P 1/2: 3.21 cm2
Height: 71 in
S' Lateral: 2.9 cm
Weight: 2880 [oz_av]

## 2024-02-12 LAB — HEMOGLOBIN A1C
Hgb A1c MFr Bld: 6.8 % — ABNORMAL HIGH (ref 4.8–5.6)
Mean Plasma Glucose: 148.46 mg/dL

## 2024-02-12 LAB — LIPID PANEL
Cholesterol: 175 mg/dL (ref 0–200)
HDL: 46 mg/dL (ref >40)
LDL Cholesterol: 104 mg/dL — ABNORMAL HIGH (ref 0–99)
Total CHOL/HDL Ratio: 3.8 ratio
Triglycerides: 125 mg/dL (ref <150)
VLDL: 25 mg/dL (ref 0–40)

## 2024-02-12 LAB — GLUCOSE, CAPILLARY
Glucose-Capillary: 153 mg/dL — ABNORMAL HIGH (ref 70–99)
Glucose-Capillary: 208 mg/dL — ABNORMAL HIGH (ref 70–99)
Glucose-Capillary: 140 mg/dL — ABNORMAL HIGH (ref 70–99)
Glucose-Capillary: 261 mg/dL — ABNORMAL HIGH (ref 70–99)

## 2024-02-12 MED ORDER — SODIUM CHLORIDE 0.9 % IV SOLN: INTRAVENOUS | Status: DC

## 2024-02-12 MED ORDER — IOHEXOL 350 MG/ML SOLN
75.0000 mL | Freq: Once | INTRAVENOUS | Status: AC | PRN
  Administered 2024-02-12: 75 mL via INTRAVENOUS

## 2024-02-12 MED ORDER — ATORVASTATIN CALCIUM 20 MG PO TABS
80.0000 mg | ORAL_TABLET | Freq: Every day | ORAL | Status: DC
  Administered 2024-02-12 – 2024-02-13 (×2): 80 mg via ORAL
  Filled 2024-02-12 (×2): qty 4

## 2024-02-12 NOTE — Evaluation (Signed)
 Speech Language Pathology Evaluation Patient Details Name: Kristopher Christensen MRN: [id number] DOB: May 03, 1951 Today's Date: 02/12/2024 Time: 9079-9054 SLP Time Calculation (min) (ACUTE ONLY): 25 min  Problem List:  Patient Active Problem List   Diagnosis Date Noted   TIA (transient ischemic attack) 02/11/2024   Controlled type 2 diabetes mellitus without complication, without long-term current use of insulin  (HCC) 02/11/2024   HLD (hyperlipidemia) 02/11/2024   Essential hypertension 12/31/2019   OSA (obstructive sleep apnea) 06/21/2019   Pure hypercholesterolemia 07/15/2018   Vaccine counseling 08/07/2015   Past Medical History:  Past Medical History:  Diagnosis Date   Cancer (HCC)    Basal cell cancer of lt.lower leg   Diabetes mellitus without complication (HCC)    Elevated lipids    Enlarged prostate    Heartburn    Hypertension    Kidney stone    Motion sickness    back seat of car, boat   Past Surgical History:  Past Surgical History:  Procedure Laterality Date   CATARACT EXTRACTION W/PHACO Right 04/16/2022   Procedure: CATARACT EXTRACTION PHACO AND INTRAOCULAR LENS PLACEMENT (IOC) RIGHT DIABETIC 9.10 00:52.7;  Surgeon: Jaye Fallow, MD;  Location: [facility];  Service: Ophthalmology;  Laterality: Right;  Diabetic   COLONOSCOPY N/A 10/14/2014   Procedure: COLONOSCOPY;  Surgeon: Deward CINDERELLA Piedmont, MD;  Location: [facility] ENDOSCOPY;  Service: Gastroenterology;  Laterality: N/A;   HOLEP-LASER ENUCLEATION OF THE PROSTATE WITH MORCELLATION N/A 12/14/2018   Procedure: HOLEP-LASER ENUCLEATION OF THE PROSTATE WITH MORCELLATION;  Surgeon: Penne Knee, MD;  Location: [facility] ORS;  Service: Urology;  Laterality: N/A;   UMBILICAL HERNIA REPAIR     HPI:  Per H&P, Kristopher Christensen is a pleasant 73 y.o. adult with medical history significant for DM, HTN, HLD who came into the ED at [facility] for evaluation of slurred speech and gait instability.  Patient stated that yesterday afternoon he  noticed unstable gait when he was trying to walk.  He does not typically require any assistace but yesterday he was weak.  His wife noticed that he was slurred and possible facial weakness.  He required help to walk around.  Patient's wife stated that he did not want to come to the hospital yesterday.  Brought in to the hospital today.  Patient denies any history of prior stroke, denies any numbness or tingling or focal weaknesses.  Patient denies any fever chills nausea vomiting abdominal pain chest pain shortness of breath or palpitations. 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  2. Additional subcentimeter acute ischemic nonhemorrhagic infarct  involving the high parafalcine left frontal lobe.  3. Underlying mild to moderate chronic microvascular ischemic  disease with remote lacunar infarct at the posterior right basal  ganglia.   Assessment / Plan / Recommendation Clinical Impression  Pt seen for cognitive linguistic evaluation in the setting of CVA work up. Assessment consisting of pt/spouse interview and completion of dynamic assessment. Independent orientation, awareness, functional recall, simple problem solving, expressive/receptive language, and visual processing demonstrated during assessment. Motor speech notable for minimally imprecise articulation- with pt/spouse reporting speech approximating baseline performance. Overall, pt/spouse deny overt cognitive communication, dysphagia (not directly addressed- pt passed swallow screen), and dysarthria concerns- indicating that pt is nearing baseline function. Education shared regarding closely monitoring cognition/speech and seeking SLP services following discharge if warranted. All reported understanding.      SLP Assessment  SLP Recommendation/Assessment: Patient does not need any further Speech Language Pathology Services SLP  Visit Diagnosis:  (r/o acute dysarthria/cognitive communication  impairment)     Assistance Recommended at Discharge  Set up Supervision/Assistance  Functional Status Assessment Patient has not had a recent decline in their functional status  Frequency and Duration           SLP Evaluation Cognition  Overall Cognitive Status: Within Functional Limits for tasks assessed Arousal/Alertness: Awake/alert Orientation Level: Oriented X4 Attention: Sustained Sustained Attention: Appears intact Memory: Appears intact Awareness: Appears intact Problem Solving: Appears intact Safety/Judgment: Appears intact       Comprehension  Auditory Comprehension Overall Auditory Comprehension: Appears within functional limits for tasks assessed Visual Recognition/Discrimination Discrimination: Within Function Limits Reading Comprehension Reading Status: Not tested    Expression Expression Primary Mode of Expression: Verbal Verbal Expression Overall Verbal Expression: Appears within functional limits for tasks assessed Written Expression Written Expression: Not tested   Oral / Motor  Oral Motor/Sensory Function Overall Oral Motor/Sensory Function: Within functional limits Motor Speech Overall Motor Speech: Other (comment) (minimal impairment- improving since admission- near baseline) Respiration: Within functional limits Phonation: Normal Resonance: Within functional limits Articulation: Impaired Level of Impairment: Conversation Intelligibility: Intelligible Motor Planning: Within functional limits Effective Techniques:  (none needed)           [place] Karlissa Aron Clapp, MS, CCC-SLP Speech Language Pathologist Rehab Services; [facility] [phone] (ascom)   [place] J Clapp 02/12/2024, 11:30 AM

## 2024-02-12 NOTE — Plan of Care (Signed)
  Problem: Ischemic Stroke/TIA Tissue Perfusion: Goal: Complications of ischemic stroke/TIA will be minimized Outcome: Progressing   Problem: Coping: Goal: Will verbalize positive feelings about self Outcome: Progressing   Problem: Self-Care: Goal: Ability to participate in self-care as condition permits will improve Outcome: Progressing   Problem: Clinical Measurements: Goal: Ability to maintain clinical measurements within normal limits will improve Outcome: Progressing

## 2024-02-12 NOTE — Care Management Obs Status (Signed)
 MEDICARE OBSERVATION STATUS NOTIFICATION   Patient Details  Name: Kristopher Christensen MRN: [id number] Date of Birth: December 09, 1950   Medicare Observation Status Notification Given:  Yes    Roshana Shuffield W, CMA 02/12/2024, 10:56 AM

## 2024-02-12 NOTE — Progress Notes (Signed)
 Mobility Specialist - Progress Note     02/12/24 1510  Mobility  Activity Ambulated independently;Stood at bedside  Level of Assistance Independent  Assistive Device None  Distance Ambulated (ft) 300 ft  Range of Motion/Exercises Active  Activity Response Tolerated well  Mobility Referral Yes  Mobility visit 1 Mobility  Mobility Specialist Start Time (ACUTE ONLY) 1447  Mobility Specialist Stop Time (ACUTE ONLY) 1503  Mobility Specialist Time Calculation (min) (ACUTE ONLY) 16 min   Pt resting in bed on RA upon entry. Pt STS and ambulates to hallway around NS Indep with no AD. Pt gait was moderately stable throughout session. Pt returned to bed and left with needs in reach. Wife present at bedside.   Guido Rumble Mobility Specialist 02/12/24, 3:18 PM

## 2024-02-12 NOTE — Evaluation (Signed)
 Physical Therapy Evaluation Patient Details Name: Kristopher Christensen MRN: [id number] DOB: 1950-09-07 Today's Date: 02/12/2024  History of Present Illness  Pt is a 73 y.o. 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 PMH significant for HTN, HLD, diabetes.  Clinical Impression  Patient received in recliner. Wife at bedside. He is agreeable to PT assessment. Patient is supervision to cga for all mobility. Had one minor bout of unsteadiness, but improved with increased distance. Patient ambulated 250 feet without AD, up/down 4 steps with supervision. He appears to be close to baseline. We discussed out patient PT for potential balance training. Patient does not have any acute needs at this time and can ambulate with staff/family while here.           If plan is discharge home, recommend the following:  N/a   Can travel by private vehicle    yes    Equipment Recommendations None recommended by PT  Recommendations for Other Services    N/a   Functional Status Assessment Patient has not had a recent decline in their functional status     Precautions / Restrictions Precautions Precautions: Fall Recall of Precautions/Restrictions: Intact Restrictions Weight Bearing Restrictions Per Provider Order: No      Mobility  Bed Mobility               General bed mobility comments: NT    Transfers Overall transfer level: Needs assistance Equipment used: None Transfers: Sit to/from Stand Sit to Stand: Supervision                Ambulation/Gait Ambulation/Gait assistance: Supervision, Contact guard assist Gait Distance (Feet): 250 Feet Assistive device: None Gait Pattern/deviations: Step-through pattern Gait velocity: WNL     General Gait Details: 1 mild occurance of instability but no lob  Stairs Stairs: Yes Stairs assistance: Supervision Stair Management: One rail Left, Alternating  pattern Number of Stairs: 4 General stair comments: safe on steps  Wheelchair Mobility     Tilt Bed    Modified Rankin (Stroke Patients Only) Modified Rankin (Stroke Patients Only) Pre-Morbid Rankin Score: No symptoms Modified Rankin: No significant disability     Balance Overall balance assessment: Mild deficits observed, not formally tested Sitting-balance support: No upper extremity supported Sitting balance-Leahy Scale: Normal     Standing balance support: No upper extremity supported, During functional activity                                 Pertinent Vitals/Pain Pain Assessment Pain Assessment: No/denies pain    Home Living Family/patient expects to be discharged to:: Private residence Living Arrangements: Spouse/significant other Available Help at Discharge: Family;Available 24 hours/day Type of Home: House Home Access: Stairs to enter Entrance Stairs-Rails: Left Entrance Stairs-Number of Steps: 3   Home Layout: One level Home Equipment: Shower seat      Prior Function Prior Level of Function : Independent/Modified Independent;Driving             Mobility Comments: independent, no falls ADLs Comments: independent, driving     Extremity/Trunk Assessment   Upper Extremity Assessment Upper Extremity Assessment: Defer to OT evaluation RUE Sensation: WNL RUE Coordination: WNL LUE Sensation: WNL LUE Coordination: WNL    Lower Extremity Assessment Lower Extremity Assessment: Overall WFL for tasks assessed    Cervical / Trunk Assessment Cervical / Trunk Assessment: Normal  Communication  Communication Communication: Impaired Factors Affecting Communication: Reduced clarity of speech;Other (comment) (very mild)    Cognition Arousal: Alert Behavior During Therapy: WFL for tasks assessed/performed   PT - Cognitive impairments: No apparent impairments                         Following commands: Intact        Cueing Cueing Techniques: Verbal cues     General Comments      Exercises     Assessment/Plan    PT Assessment All further PT needs can be met in the next venue of care  PT Problem List Decreased balance       PT Treatment Interventions      PT Goals (Current goals can be found in the Care Plan section)  Acute Rehab PT Goals Patient Stated Goal: return home PT Goal Formulation: With patient/family Time For Goal Achievement: 02/14/24 Potential to Achieve Goals: Good    Frequency       Co-evaluation               AM-PAC PT 6 Clicks Mobility  Outcome Measure Help needed turning from your back to your side while in a flat bed without using bedrails?: None Help needed moving from lying on your back to sitting on the side of a flat bed without using bedrails?: None Help needed moving to and from a bed to a chair (including a wheelchair)?: None Help needed standing up from a chair using your arms (e.g., wheelchair or bedside chair)?: None Help needed to walk in hospital room?: A Little Help needed climbing 3-5 steps with a railing? : A Little 6 Click Score: 22    End of Session   Activity Tolerance: Patient tolerated treatment well Patient left: in chair;with call bell/phone within reach;with family/visitor present Nurse Communication: Mobility status      Time: 1057-1109 PT Time Calculation (min) (ACUTE ONLY): 12 min   Charges:   PT Evaluation $PT Eval Low Complexity: 1 Low   PT General Charges $$ ACUTE PT VISIT: 1 Visit         Ashliegh Parekh, PT, GCS 02/12/24,11:52 AM

## 2024-02-12 NOTE — Progress Notes (Signed)
 PROGRESS NOTE    Kristopher Christensen  FMW:[id number] DOB: 1950/11/04 DOA: 02/11/2024 PCP: Alla Amis, MD     Brief Narrative:   From admission h and p  Luke GORMAN Rake is a pleasant 73 y.o. adult with medical history significant for DM, HTN, HLD who came into the ED at [facility] for evaluation of slurred speech and gait instability.  Patient stated that yesterday afternoon he noticed unstable gait when he was trying to walk.  He does not typically require any assistace but yesterday he was weak.  His wife noticed that he was slurred and possible facial weakness.  He required help to walk around.  Patient's wife stated that he did not want to come to the hospital yesterday.  Brought in to the hospital today.  Patient denies any history of prior stroke, denies any numbness or tingling or focal weaknesses.  Patient denies any fever chills nausea vomiting abdominal pain chest pain shortness of breath or palpitations.    Assessment & Plan:   Principal Problem:   Acute CVA (cerebrovascular accident) (HCC) Active Problems:   Pure hypercholesterolemia   Controlled type 2 diabetes mellitus without complication, without long-term current use of insulin  (HCC)   HLD (hyperlipidemia)   # Acute CVA MRI shows two acute strokes on right basal ganglia and there other left frontal. Concern then for central etiology. TTE negative, CTA neg for large vessel disease. Ldl 104. Symptoms mainly gait dysfunction and difficulty with coordination, much improved. Sinus rhythm on EKG and tele - pt/ot/slp evals - cards consult for TEE - continue aspirin , antiplatelets per neurology - atorva 80 for home lovastatin - maintain tele, will need cardiac monitor at d/c  # T2DM Well controlled A1c 6.8% - SSI here  # HTN Permissive htn here - home chlorthalidone and lisinopril  on hold  # OSA Not treated - advise outpt f/u, treating if still present   DVT prophylaxis: lovenox  Code Status: full Family  Communication: wife updated @ bedside  Level of care: Telemetry Medical Status is: Observation    Consultants:  Neurology, cardiology  Procedures: TEE pending  Antimicrobials:  none    Subjective: Reports feeling well  Objective: Vitals:   02/12/24 0026 02/12/24 0420 02/12/24 0752 02/12/24 1200  BP: (!) 151/78 (!) 159/78 (!) 162/80 (!) 152/75  Pulse: (!) 48 (!) 55 (!) 50 (!) 56  Resp: 17 16 15 17   Temp: (!) 97.4 F (36.3 C) 97.8 F (36.6 C) 97.8 F (36.6 C) 98 F (36.7 C)  TempSrc: Oral Oral  Oral  SpO2: 99% 98% 97% 97%  Weight:      Height:        Intake/Output Summary (Last 24 hours) at 02/12/2024 1518 Last data filed at 02/12/2024 1300 Gross per 24 hour  Intake 546.91 ml  Output --  Net 546.91 ml   Filed Weights   02/11/24 1052  Weight: 81.6 kg    Examination:  General exam: Appears calm and comfortable  Respiratory system: Clear to auscultation. Respiratory effort normal. Cardiovascular system: S1 & S2 heard, RRR. No JVD, murmurs, rubs, gallops or clicks. No pedal edema. Gastrointestinal system: Abdomen is nondistended, soft and nontender. No organomegaly or masses felt. Normal bowel sounds heard. Central nervous system: Alert and oriented. No focal neurological deficits. Extremities: Symmetric 5 x 5 power. Skin: No rashes, lesions or ulcers Psychiatry: Judgement and insight appear normal. Mood & affect appropriate.     Data Reviewed: I have personally reviewed following labs and imaging studies  CBC:  Recent Labs  Lab 02/11/24 1053 02/11/24 1423 02/12/24 0532  WBC 5.7 6.3 4.8  NEUTROABS 3.5  --   --   HGB 13.6 13.5 13.2  HCT 38.4* 39.3 37.5*  MCV 86.9 87.1 86.6  PLT 215 207 201   Basic Metabolic Panel: Recent Labs  Lab 02/11/24 1053 02/11/24 1423 02/11/24 1638 02/12/24 0532  NA 139  --  141  --   K 3.8  --  3.8  --   CL 104  --  107  --   CO2 24  --  25  --   GLUCOSE 237*  --  138*  --   BUN 15  --  15  --   CREATININE 1.07  1.08 1.13  --   CALCIUM  9.1  --  8.9  --   MG  --   --   --  2.1   GFR: Estimated Creatinine Clearance (by C-G formula based on SCr of 1.13 mg/dL) Male: 50.3 mL/min Male: 62 mL/min Liver Function Tests: Recent Labs  Lab 02/11/24 1053 02/11/24 1638  AST 29 21  ALT 21 19  ALKPHOS 70 65  BILITOT 0.7 0.7  PROT 6.7 6.7  ALBUMIN 4.2 4.2   No results for input(s): LIPASE, AMYLASE in the last 168 hours. No results for input(s): AMMONIA in the last 168 hours. Coagulation Profile: Recent Labs  Lab 02/11/24 1053  INR 1.0   Cardiac Enzymes: No results for input(s): CKTOTAL, CKMB, CKMBINDEX, TROPONINI in the last 168 hours. BNP (last 3 results) No results for input(s): PROBNP in the last 8760 hours. HbA1C: Recent Labs    02/11/24 1423  HGBA1C 6.8*   CBG: Recent Labs  Lab 02/11/24 1646 02/11/24 1951 02/12/24 0754 02/12/24 1203  GLUCAP 129* 227* 153* 208*   Lipid Profile: Recent Labs    02/12/24 0532  CHOL 175  HDL 46  LDLCALC 104*  TRIG 125  CHOLHDL 3.8   Thyroid Function Tests: No results for input(s): TSH, T4TOTAL, FREET4, T3FREE, THYROIDAB in the last 72 hours. Anemia Panel: No results for input(s): VITAMINB12, FOLATE, FERRITIN, TIBC, IRON, RETICCTPCT in the last 72 hours. Urine analysis:    Component Value Date/Time   APPEARANCEUR Clear 12/03/2018 1000   GLUCOSEU Trace (A) 12/03/2018 1000   BILIRUBINUR Negative 12/03/2018 1000   PROTEINUR Negative 12/03/2018 1000   NITRITE Negative 12/03/2018 1000   LEUKOCYTESUR Negative 12/03/2018 1000   Sepsis Labs: @LABRCNTIP (procalcitonin:4,lacticidven:4)  )No results found for this or any previous visit (from the past 240 hours).       Radiology Studies: CT ANGIO HEAD NECK W WO CM Result Date: 02/12/2024 CLINICAL DATA:  73 year old male with right basal ganglia lacunar infarct on MRI yesterday. EXAM: CT ANGIOGRAPHY HEAD AND NECK WITH AND WITHOUT CONTRAST TECHNIQUE:  Multidetector CT imaging of the head and neck was performed using the standard protocol during bolus administration of intravenous contrast. Multiplanar CT image reconstructions and MIPs were obtained to evaluate the vascular anatomy. Carotid stenosis measurements (when applicable) are obtained utilizing NASCET criteria, using the distal internal carotid diameter as the denominator. RADIATION DOSE REDUCTION: This exam was performed according to the departmental dose-optimization program which includes automated exposure control, adjustment of the mA and/or kV according to patient size and/or use of iterative reconstruction technique. CONTRAST:  75mL OMNIPAQUE  IOHEXOL  350 MG/ML SOLN COMPARISON:  Head CT and brain MRI yesterday. FINDINGS: CT HEAD Brain: Slightly increased conspicuity of hypodensity corresponding to right corona radiata and posterior lentiform lacunar infarct on MRI. No  acute intracranial hemorrhage identified. No midline shift, mass effect, or evidence of intracranial mass lesion. No cortically based acute infarct identified. Calvarium and skull base: Stable. Paranasal sinuses: Visualized paranasal sinuses and mastoids are stable and well aerated. Orbits: Stable orbit and scalp soft tissues. CTA NECK Skeleton: Ordinary cervical spine degeneration. No acute osseous abnormality identified. Upper chest: Negative. Other neck: Nonvascular neck soft tissue spaces appear negative. Aortic arch: 3 vessel arch.  Minimal arch atherosclerosis. Right carotid system: Patent with mild pulsation artifact at the thoracic inlet. Minimal atherosclerosis at the right ICA origin. Tortuous right ICA distal to the bulb. No stenosis. Left carotid system: Patent with minimal atherosclerosis and no stenosis. Mild left ICA tortuosity. Vertebral arteries: Proximal right subclavian artery mild tortuosity. Calcified plaque at the right vertebral artery origin with mild to moderate stenosis (series 13, image 200). Patent right  vertebral to the skull base with no additional plaque or stenosis. Proximal left subclavian artery calcified plaque without stenosis. Calcified plaque near the left vertebral artery origin but no stenosis (series 13, image 199). Tortuous left V1 segment. Codominant left vertebral artery is patent without stenosis to the skull base. CTA HEAD Posterior circulation: Codominant distal vertebral arteries with normal PICA origins, normal vertebrobasilar junction. Patent basilar artery without stenosis. Normal SCA origins. Patent PCA origins. Small left posterior communicating artery, the right is diminutive or absent. Severe stenosis left PCA P1 segment with maintained distal patency. Mild P3 branch irregularity on that side. Right PCA mild P1 through P3 segment irregularity, no significant right PCA stenosis. Anterior circulation: Both ICA siphons are patent. Minimal left siphon plaque without stenosis. Minimal right siphon plaque without stenosis. Normal posterior communicating artery origins. Patent carotid termini. Normal MCA and ACA origins. Dominant right ACA A1 segment. Normal anterior communicating artery. ACA branches are within normal limits. Left MCA M1 segment bifurcates early without stenosis. Moderate stenosis superior left M2 division on series 16, image 24. And multifocal moderate additional irregularity and stenoses of posterior left M2 and M3 divisions on series 17, image 30. Contralateral right MCA M1 segment is patent with mild irregularity. Right MCA bifurcation is patent without stenosis. Right MCA branches are patent with only mild irregularity. Venous sinuses: Patent. Anatomic variants: Dominant right ACA A1. Review of the MIP images confirms the above findings IMPRESSION: 1. Negative for large vessel occlusion. No significant carotid plaque or stenosis. But positive for intracranial atherosclerosis including multifocal Moderate stenoses of Left MCA branches, and up to Severe stenosis of the left  PCA P2 segment. But no Right MCA branch occlusion or significant stenosis identified. 2. Calcified plaque at the Right Vertebral Artery origin with Moderate stenosis. 3. Expected CT appearance of known Right hemisphere lacunar infarct. No intracranial hemorrhage or mass effect. Electronically Signed   By: VEAR Hurst M.D.   On: 02/12/2024 13:59   ECHOCARDIOGRAM COMPLETE Result Date: 02/12/2024    ECHOCARDIOGRAM REPORT   Patient Name:   Kristopher Christensen Date of Exam: 02/11/2024 Medical Rec #:  [id number]     Height:       71.0 in Accession #:    [id number]    Weight:       180.0 lb Date of Birth:  01/20/1951     BSA:          2.016 m Patient Age:    73 years      BP:           187/93 mmHg Patient Gender: M  HR:           59 bpm. Exam Location:  [facility] Procedure: 2D Echo, Cardiac Doppler and Color Doppler (Both Spectral and Color            Flow Doppler were utilized during procedure). Indications:     G45.9 TIA  History:         Patient has no prior history of Echocardiogram examinations.                  Risk Factors:Diabetes and Hypertension.  Sonographer:     Carl Coma RDCS Referring Phys:  [id number] [facility] PAUDEL Diagnosing Phys: Marsa Dooms MD IMPRESSIONS  1. Left ventricular ejection fraction, by estimation, is 60 to 65%. The left ventricle has normal function. The left ventricle has no regional wall motion abnormalities. Left ventricular diastolic parameters are consistent with Grade I diastolic dysfunction (impaired relaxation).  2. Right ventricular systolic function is normal. The right ventricular size is normal.  3. The mitral valve is normal in structure. Trivial mitral valve regurgitation. No evidence of mitral stenosis.  4. The aortic valve is normal in structure. Aortic valve regurgitation is not visualized. No aortic stenosis is present.  5. The inferior vena cava is normal in size with greater than 50% respiratory variability, suggesting right atrial pressure of 3 mmHg.  FINDINGS  Left Ventricle: Left ventricular ejection fraction, by estimation, is 60 to 65%. The left ventricle has normal function. The left ventricle has no regional wall motion abnormalities. Strain was performed and the global longitudinal strain is indeterminate. The left ventricular internal cavity size was normal in size. There is no left ventricular hypertrophy. Left ventricular diastolic parameters are consistent with Grade I diastolic dysfunction (impaired relaxation). Right Ventricle: The right ventricular size is normal. No increase in right ventricular wall thickness. Right ventricular systolic function is normal. Left Atrium: Left atrial size was normal in size. Right Atrium: Right atrial size was normal in size. Pericardium: There is no evidence of pericardial effusion. Mitral Valve: The mitral valve is normal in structure. Trivial mitral valve regurgitation. No evidence of mitral valve stenosis. Tricuspid Valve: The tricuspid valve is normal in structure. Tricuspid valve regurgitation is trivial. No evidence of tricuspid stenosis. Aortic Valve: The aortic valve is normal in structure. Aortic valve regurgitation is not visualized. No aortic stenosis is present. Pulmonic Valve: The pulmonic valve was normal in structure. Pulmonic valve regurgitation is not visualized. No evidence of pulmonic stenosis. Aorta: The aortic root is normal in size and structure. Venous: The inferior vena cava is normal in size with greater than 50% respiratory variability, suggesting right atrial pressure of 3 mmHg. IAS/Shunts: No atrial level shunt detected by color flow Doppler. Additional Comments: 3D was performed not requiring image post processing on an independent workstation and was indeterminate.  LEFT VENTRICLE PLAX 2D LVIDd:         5.00 cm   Diastology LVIDs:         2.90 cm   LV e' medial:    6.36 cm/s LV PW:         0.80 cm   LV E/e' medial:  12.1 LV IVS:        0.80 cm   LV e' lateral:   7.50 cm/s LVOT diam:      2.10 cm   LV E/e' lateral: 10.3 LV SV:         105 LV SV Index:   52 LVOT Area:     3.46 cm  RIGHT VENTRICLE             IVC RV Basal diam:  5.10 cm     IVC diam: 1.50 cm RV S Christensen:     15.10 cm/s TAPSE (M-mode): 2.6 cm LEFT ATRIUM             Index        RIGHT ATRIUM           Index LA diam:        4.30 cm 2.13 cm/m   RA Area:     14.00 cm LA Vol (A2C):   61.0 ml 30.25 ml/m  RA Volume:   34.90 ml  17.31 ml/m LA Vol (A4C):   46.2 ml 22.91 ml/m LA Biplane Vol: 55.7 ml 27.63 ml/m  AORTIC VALVE LVOT Vmax:   125.00 cm/s LVOT Vmean:  82.100 cm/s LVOT VTI:    0.302 m  AORTA Ao Root diam: 4.10 cm Ao Asc diam:  4.00 cm MITRAL VALVE MV Area (PHT): 3.21 cm    SHUNTS MV Decel Time: 236 msec    Systemic VTI:  0.30 m MV E velocity: 77.00 cm/s  Systemic Diam: 2.10 cm MV A velocity: 88.00 cm/s MV E/A ratio:  0.88 Marsa Dooms MD Electronically signed by Marsa Dooms MD Signature Date/Time: 02/12/2024/1:25:16 PM    Final    MR BRAIN WO CONTRAST Result Date: 02/11/2024 CLINICAL DATA:  Initial evaluation for acute TIA. EXAM: MRI HEAD WITHOUT CONTRAST TECHNIQUE: Multiplanar, multiecho pulse sequences of the brain and surrounding structures were obtained without intravenous contrast. COMPARISON:  Prior CT from earlier the same day. FINDINGS: Brain: Limits cerebral volume within normal limits. Scattered patchy T2/FLAIR hyperintensity involving the periventricular deep white matter both cerebral hemispheres, consistent with chronic small vessel ischemic disease, mild to moderate in nature. Remote lacunar infarct present at the posterior right basal ganglia. 2.5 cm focus of restricted diffusion involving the right basal ganglia, consistent with an acute ischemic perforator type infarct. Additional subcentimeter acute ischemic infarcts noted involving the high parafalcine left frontal lobe (series 5, image 44). No associated hemorrhage or mass effect about these infarcts. Gray-white matter differentiation  otherwise maintained. No areas of chronic cortical infarction. No acute intracranial hemorrhage. Few chronic micro hemorrhages noted, likely small vessel related. No mass lesion, midline shift or mass effect. No hydrocephalus or extra-axial fluid collection. Pituitary gland within normal limits. Vascular: Major intracranial vascular flow voids are maintained. Skull and upper cervical spine: Craniocervical junction within normal limits. Bone marrow signal intensity normal. No scalp soft tissue abnormality. Sinuses/Orbits: Prior ocular lens replacement on the right. Mild scattered mucosal thickening present about the ethmoidal air cells. Paranasal sinuses are otherwise clear. Trace right mastoid effusion noted, of doubtful significance. Other: None. IMPRESSION: 1. 2.5 cm acute ischemic perforator type infarct involving the right basal ganglia. 2. Additional subcentimeter acute ischemic nonhemorrhagic infarct involving the high parafalcine left frontal lobe. 3. Underlying mild to moderate chronic microvascular ischemic disease with remote lacunar infarct at the posterior right basal ganglia. Electronically Signed   By: Morene Hoard M.D.   On: 02/11/2024 22:12   CT HEAD WO CONTRAST Result Date: 02/11/2024 EXAM: CT HEAD WITHOUT CONTRAST 02/11/2024 11:23:31 AM TECHNIQUE: CT of the head was performed without the administration of intravenous contrast. Automated exposure control, iterative reconstruction, and/or weight based adjustment of the mA/kV was utilized to reduce the radiation dose to as low as reasonably achievable. COMPARISON: None available. CLINICAL HISTORY: Neuro deficit, acute, stroke suspected. Slurred speech and  dizziness. States he is having difficulty with coordination. LWK 1200 yesterday. Wife reports a facial droop yesterday. FINDINGS: BRAIN AND VENTRICLES: Nonspecific hypoattenuation in the periventricular and subcortical white matter, most likely representing chronic small vessel disease.  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 No acute hemorrhage. No evidence of acute infarct. No hydrocephalus. No extra-axial collection. No mass effect or midline shift. ORBITS: No acute abnormality. SINUSES: No acute abnormality. SOFT TISSUES AND SKULL: No acute soft tissue abnormality. No skull fracture. Atherosclerosis of the carotid siphons. Right lens replacement. IMPRESSION: 1. No acute intracranial abnormality. 2. Mild chronic small vessel disease. 3. 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 Remote infarct in the left corona radiata. Electronically signed by: Donnice Mania MD 02/11/2024 11:47 AM EDT RP Workstation: [id number]        Scheduled Meds:  aspirin  EC  81 mg Oral QPM   atorvastatin   80 mg Oral Daily   enoxaparin  (LOVENOX ) injection  40 mg Subcutaneous Q24H   insulin  aspart  0-5 Units Subcutaneous QHS   insulin  aspart  0-9 Units Subcutaneous TID WC   lisinopril   30 mg Oral QHS   Continuous Infusions:   LOS: 0 days     Devaughn KATHEE Ban, MD [facility]   If 7PM-7AM, please contact night-coverage [email or web address] Password [facility] 02/12/2024, 3:18 PM

## 2024-02-12 NOTE — Plan of Care (Signed)

## 2024-02-12 NOTE — Evaluation (Signed)
 Occupational Therapy Evaluation Patient Details Name: Kristopher Christensen MRN: [id number] DOB: [birth year] Today's Date: 02/12/2024   History of Present Illness   Pt is a 73 y.o. 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 PMH significant for HTN, HLD, diabetes.     Clinical Impressions Pt admitted with above. Prior to admission, pt was independent in ADL/IADLs including driving. Pt reports feeling at or near functional baseline with strength, coordination and vision WNL. No focal deficits appreciated, 4+/5 MMT BUE with good bilateral grip strength. Pt is able to complete functional transfers and mobility with unilateral support on IV pole to/from bathroom, simulated toilet transfer with use of GB, and supervision for LB dressing. Minor balance deficits observed. Educated pt and spouse on receiving clearance from MD prior to returning to driving, pt and spouse verbalize understanding with all questions answered. No further acute needs identified, OT to complete orders and sign off.      If plan is discharge home, recommend the following:   A little help with walking and/or transfers;A little help with bathing/dressing/bathroom;Assist for transportation;Help with stairs or ramp for entrance     Functional Status Assessment   Patient has had a recent decline in their functional status and demonstrates the ability to make significant improvements in function in a reasonable and predictable amount of time.     Equipment Recommendations   None recommended by OT      Precautions/Restrictions   Precautions Precautions: Fall Restrictions Weight Bearing Restrictions Per Provider Order: No     Mobility Bed Mobility Overal bed mobility: Modified Independent             General bed mobility comments: HOB elevated    Transfers Overall transfer level: Needs assistance Equipment used: None Transfers:  Sit to/from Stand, Bed to chair/wheelchair/BSC Sit to Stand: Supervision     Step pivot transfers: Supervision     General transfer comment: no AD for step pivot bed > recliner and simulated toilet transfer      Balance Overall balance assessment: Needs assistance Sitting-balance support: Feet supported, No upper extremity supported Sitting balance-Leahy Scale: Normal     Standing balance support: Single extremity supported                               ADL either performed or assessed with clinical judgement   ADL Overall ADL's : Needs assistance/impaired                     Lower Body Dressing: Sit to/from stand;Supervision/safety Lower Body Dressing Details (indicate cue type and reason): doff/don socks Toilet Transfer: Supervision/safety;Regular Teacher, adult education Details (indicate cue type and reason): ambulating pushing IV pole, supervision         Functional mobility during ADLs: Supervision/safety;Cueing for safety;Cueing for sequencing General ADL Comments: t/f bathroom with unilateral support on IV pole, pt requires cues for safety awareness     Vision Baseline Vision/History: 1 Wears glasses Ability to See in Adequate Light: 0 Adequate Patient Visual Report: No change from baseline (upcoming cateract surgery, able to read clock WNL) Vision Assessment?: Wears glasses for reading Additional Comments: no change from baseline            Pertinent Vitals/Pain Pain Assessment Pain Assessment: No/denies pain     Extremity/Trunk Assessment Upper Extremity Assessment RUE Sensation: WNL RUE Coordination: WNL LUE Sensation:  WNL LUE Coordination: WNL   Lower Extremity Assessment Lower Extremity Assessment: Overall WFL for tasks assessed   Cervical / Trunk Assessment Cervical / Trunk Assessment: Normal   Communication Communication Communication: Impaired Factors Affecting Communication: Reduced clarity of speech;Other (comment)  (very mild)   Cognition Arousal: Alert Behavior During Therapy: WFL for tasks assessed/performed               OT - Cognition Comments: cues for safety awareness re: IV pole                 Following commands: Intact       Cueing  General Comments   Cueing Techniques: Verbal cues  VSS on RA           Home Living Family/patient expects to be discharged to:: Private residence Living Arrangements: Spouse/significant other Available Help at Discharge: Family;Available 24 hours/day Type of Home: House Home Access: Stairs to enter [employer] of Steps: 3 Entrance Stairs-Rails: Left Home Layout: One level     Bathroom Shower/Tub: Producer, television/film/video: Standard     Home Equipment: Shower seat          Prior Functioning/Environment Prior Level of Function : Independent/Modified Independent;Driving             Mobility Comments: independent, no falls ADLs Comments: independent, driving    OT Problem List: Decreased safety awareness;Impaired balance (sitting and/or standing)        OT Goals(Current goals can be found in the care plan section)   Acute Rehab OT Goals OT Goal Formulation: All assessment and education complete, DC therapy Potential to Achieve Goals: Good   AM-PAC OT 6 Clicks Daily Activity     Outcome Measure Help from another person eating meals?: None Help from another person taking care of personal grooming?: None Help from another person toileting, which includes using toliet, bedpan, or urinal?: A Little Help from another person bathing (including washing, rinsing, drying)?: None Help from another person to put on and taking off regular upper body clothing?: None Help from another person to put on and taking off regular lower body clothing?: A Little 6 Click Score: 22   End of Session Nurse Communication: Mobility status  Activity Tolerance: Patient tolerated treatment well Patient left: in  chair;with call bell/phone within reach;with chair alarm set;with family/visitor present  OT Visit Diagnosis: Other abnormalities of gait and mobility (R26.89);Unsteadiness on feet (R26.81)                Time: 8971-8945 OT Time Calculation (min): 26 min Charges:  OT General Charges $OT Visit: 1 Visit OT Evaluation $OT Eval Low Complexity: 1 Low OT Treatments $Self Care/Home Management : 8-22 mins  Carthel Castille L. Brysun Eschmann, OTR/L  02/12/24, 12:38 PM

## 2024-02-12 NOTE — Progress Notes (Signed)
   [facility] has been requested to perform a transesophageal echocardiogram on Kristopher Christensen for stroke.  After careful review of history and examination, the risks and benefits of transesophageal echocardiogram have been explained including risks of esophageal damage, perforation (1:10,000 risk), bleeding, pharyngeal hematoma as well as other potential complications associated with conscious sedation including aspiration, arrhythmia, respiratory failure and death. Alternatives to treatment were discussed, questions were answered. Patient is willing to proceed.  Pt is scheduled 9/12 @ 7:30 AM w/ Dr. Argentina.  Lonni Meager, NP  02/12/2024 4:26 PM

## 2024-02-13 ENCOUNTER — Encounter: Payer: Self-pay | Admitting: Hospitalist

## 2024-02-13 ENCOUNTER — Inpatient Hospital Stay (HOSPITAL_BASED_OUTPATIENT_CLINIC_OR_DEPARTMENT_OTHER)
Admit: 2024-02-13 | Discharge: 2024-02-13 | Disposition: A | Discharge status: Home / Self Care | Attending: Nurse Practitioner | Admitting: Nurse Practitioner

## 2024-02-13 ENCOUNTER — Observation Stay: Admitting: Anesthesiology

## 2024-02-13 ENCOUNTER — Encounter
Admission: EM | Disposition: A | Discharge status: Home / Self Care | Payer: Self-pay | Source: Home / Self Care | Attending: Emergency Medicine

## 2024-02-13 ENCOUNTER — Other Ambulatory Visit: Payer: Self-pay

## 2024-02-13 ENCOUNTER — Observation Stay
Admit: 2024-02-13 | Discharge: 2024-02-13 | Disposition: A | Discharge status: Home / Self Care | Attending: Cardiovascular Disease | Admitting: Cardiovascular Disease

## 2024-02-13 DIAGNOSIS — I639 Cerebral infarction, unspecified: Secondary | ICD-10-CM

## 2024-02-13 HISTORY — PX: TEE WITHOUT CARDIOVERSION: SHX5443

## 2024-02-13 LAB — GLUCOSE, CAPILLARY
Glucose-Capillary: 149 mg/dL — ABNORMAL HIGH (ref 70–99)
Glucose-Capillary: 127 mg/dL — ABNORMAL HIGH (ref 70–99)

## 2024-02-13 LAB — ECHO TEE

## 2024-02-13 SURGERY — ECHOCARDIOGRAM, TRANSESOPHAGEAL
Anesthesia: General

## 2024-02-13 MED ORDER — PHENYLEPHRINE 80 MCG/ML (10ML) SYRINGE FOR IV PUSH (FOR BLOOD PRESSURE SUPPORT)
PREFILLED_SYRINGE | INTRAVENOUS | Status: AC
  Filled 2024-02-13: qty 10

## 2024-02-13 MED ORDER — ASPIRIN 81 MG PO TABS: 81.0000 mg | ORAL_TABLET | Freq: Every evening | ORAL | Status: AC

## 2024-02-13 MED ORDER — PROPOFOL 1000 MG/100ML IV EMUL
INTRAVENOUS | Status: AC
  Filled 2024-02-13: qty 100

## 2024-02-13 MED ORDER — GLYCOPYRROLATE 0.2 MG/ML IJ SOLN
INTRAMUSCULAR | Status: AC
  Filled 2024-02-13: qty 1

## 2024-02-13 MED ORDER — CLOPIDOGREL BISULFATE 75 MG PO TABS
75.0000 mg | ORAL_TABLET | Freq: Every day | ORAL | Status: DC
  Administered 2024-02-13: 75 mg via ORAL
  Filled 2024-02-13: qty 1

## 2024-02-13 MED ORDER — ATORVASTATIN CALCIUM 40 MG PO TABS
40.0000 mg | ORAL_TABLET | Freq: Every day | ORAL | 1 refills | Status: AC
  Filled 2024-02-13: qty 90, 90d supply, fill #0

## 2024-02-13 MED ORDER — CLOPIDOGREL BISULFATE 75 MG PO TABS
75.0000 mg | ORAL_TABLET | Freq: Every day | ORAL | 1 refills | Status: AC
  Filled 2024-02-13: qty 90, 90d supply, fill #0

## 2024-02-13 MED ORDER — LIDOCAINE HCL (PF) 2 % IJ SOLN
INTRAMUSCULAR | Status: AC
Start: 2024-02-13 — End: 2024-02-13
  Filled 2024-02-13: qty 5

## 2024-02-13 MED ORDER — LIDOCAINE HCL (PF) 2 % IJ SOLN
INTRAMUSCULAR | Status: DC | PRN
  Administered 2024-02-13: 100 mg via INTRADERMAL

## 2024-02-13 MED ORDER — SUCCINYLCHOLINE CHLORIDE 200 MG/10ML IV SOSY
PREFILLED_SYRINGE | INTRAVENOUS | Status: AC
  Filled 2024-02-13: qty 10

## 2024-02-13 MED ORDER — PROPOFOL 500 MG/50ML IV EMUL
INTRAVENOUS | Status: DC | PRN
  Administered 2024-02-13: 20 mg via INTRAVENOUS
  Administered 2024-02-13: 100 mg via INTRAVENOUS

## 2024-02-13 MED ORDER — BUTAMBEN-TETRACAINE-BENZOCAINE 2-2-14 % EX AERO
INHALATION_SPRAY | CUTANEOUS | Status: AC
  Filled 2024-02-13: qty 5

## 2024-02-13 MED ORDER — LIDOCAINE VISCOUS HCL 2 % MT SOLN
OROMUCOSAL | Status: AC
  Filled 2024-02-13: qty 15

## 2024-02-13 MED ORDER — EPHEDRINE 5 MG/ML INJ
INTRAVENOUS | Status: AC
  Filled 2024-02-13: qty 5

## 2024-02-13 NOTE — H&P (Signed)
 History and Physical Interval Note:  02/13/2024 7:06 AM  Kristopher Christensen  has presented today for TEE, with the diagnosis of acute CVA.  The various methods of treatment have been discussed with the patient and family. After consideration of risks, benefits and other options for treatment, the patient has consented to:  Procedure(s): ECHOCARDIOGRAM, TRANSESOPHAGEAL (N/A) as a diagnostic intervention.  The patient's history has been reviewed, patient examined, no change in status, stable for procedure.  I have reviewed the patient's chart and labs.  Questions were answered to the patient's satisfaction.     Caron [facility]

## 2024-02-13 NOTE — TOC Transition Note (Signed)
 Transition of Care [facility]) - Discharge Note   Patient Details  Name: Kristopher Christensen MRN: [id number] Date of Birth: 07-27-1950  [facility] of [facility] CM/SW Contact:  Racheal LITTIE Schimke, RN Phone Number: 02/13/2024, 3:41 PM   Clinical Narrative: Patient discharged home with outpatient PT referral that was faxed per protocol.      Final next level of care: Home/Self Care Barriers to Discharge: Barriers Resolved   Patient Goals and CMS Choice            Discharge Placement                    Patient and family notified of of transfer: 02/13/24  Discharge Plan and Services Additional resources added to the After Visit Summary for                  DME Arranged: N/A DME Agency: NA       HH Arranged: NA HH Agency: NA        Social Drivers of Health (SDOH) Interventions SDOH Screenings   Food Insecurity: No Food Insecurity (02/11/2024)  Housing: Low Risk  (02/11/2024)  Transportation Needs: No Transportation Needs (02/11/2024)  Utilities: Not At Risk (02/11/2024)  Financial Resource Strain: Low Risk  (03/16/2023)   Received from [facility]  Social Connections: Moderately Integrated (02/11/2024)  Tobacco Use: Low Risk  (02/13/2024)     Readmission Risk Interventions     No data to display

## 2024-02-13 NOTE — Progress Notes (Signed)
*  PRELIMINARY RESULTS* Echocardiogram Echocardiogram Transesophageal has been performed.  Kristopher Christensen 02/13/2024, 8:10 AM

## 2024-02-13 NOTE — Transfer of Care (Signed)
 Immediate Anesthesia Transfer of Care Note  Patient: Kristopher Christensen  Procedure(s) Performed: ECHOCARDIOGRAM, TRANSESOPHAGEAL  Patient Location: Short Stay  Anesthesia Type:General  Level of Consciousness: drowsy  Airway & Oxygen Therapy: Patient Spontanous Breathing and Patient connected to nasal cannula oxygen  Post-op Assessment: Report given to RN and Post -op Vital signs reviewed and stable  Post vital signs: Reviewed and stable  Last Vitals:  Vitals Value Taken Time  BP    Temp    Pulse    Resp    SpO2      Last Pain:  Vitals:   02/13/24 0713  TempSrc: Oral  PainSc: 0-No pain         Complications: There were no known notable events for this encounter.

## 2024-02-13 NOTE — Anesthesia Postprocedure Evaluation (Signed)
 Anesthesia Post Note  Patient: Kristopher Christensen  Procedure(s) Performed: ECHOCARDIOGRAM, TRANSESOPHAGEAL  Patient location during evaluation: PACU Anesthesia Type: General Level of consciousness: awake Pain management: satisfactory to patient Vital Signs Assessment: post-procedure vital signs reviewed and stable Respiratory status: spontaneous breathing Cardiovascular status: stable Anesthetic complications: no   There were no known notable events for this encounter.   Last Vitals:  Vitals:   02/13/24 0845 02/13/24 0910  BP: (!) 151/85 (!) 141/76  Pulse: (!) 55 (!) 50  Resp: 13 16  Temp:  (!) 36.4 C  SpO2: 98% 97%    Last Pain:  Vitals:   02/13/24 0910  TempSrc: Oral  PainSc: 0-No pain                 VAN STAVEREN,Lamarkus Nebel

## 2024-02-13 NOTE — Progress Notes (Signed)
     [facility] REFERRAL        Occupational Therapy * Physical Therapy * Speech Therapy                           DATE  PATIENT NAME   PATIENT MRN        DIAGNOSIS/DIAGNOSIS CODE   DATE OF DISCHARGE:        PRIMARY CARE PHYSICIAN      PCP PHONE/FAX      Dear Provider (Name: [facility] outpatient __  Fax: [phone]   I certify that I have examined this patient and that occupational/physical/speech therapy is necessary on an outpatient basis.    The patient has expressed interest in completing their recommended course of therapy at your  location.  Once a formal order from the patient's primary care physician has been obtained, please  contact him/her to schedule an appointment for evaluation at your earliest convenience.   [ X]  Physical Therapy Evaluate and Treat  [  ]  Occupational Therapy Evaluate and Treat  [  ]  Speech Therapy Evaluate and Treat         The patient's primary care physician (listed above) must furnish and be responsible for a formal order such that the recommended services may be furnished while under the primary physician's care, and that the plan of care will be established and reviewed every 30 days (or more often if condition necessitates).

## 2024-02-13 NOTE — Progress Notes (Signed)
 Mobility Specialist - Progress Note     02/13/24 1113  Mobility  Activity Ambulated independently;Stood at bedside  Level of Assistance Independent after set-up  Distance Ambulated (ft) 800 ft  Range of Motion/Exercises Active  Activity Response Tolerated well  Mobility Referral Yes  Mobility visit 1 Mobility  Mobility Specialist Start Time (ACUTE ONLY) 1035  Mobility Specialist Stop Time (ACUTE ONLY) 1048  Mobility Specialist Time Calculation (min) (ACUTE ONLY) 13 min   Pt resting in bed on RA upon entry. Pt STS and had brief stumble before self correcting. Pt ambulates to hallway around NS and off unit indep with no AD. Pt returned to bed and left with needs in reach. Wife present at bedside.   Guido Rumble Mobility Specialist 02/13/24, 11:27 AM

## 2024-02-13 NOTE — Anesthesia Preprocedure Evaluation (Signed)
 Anesthesia Evaluation  Patient identified by MRN, date of birth, ID band Patient awake    Reviewed: Allergy & Precautions, NPO status , Patient's Chart, lab work & pertinent test results  Airway Mallampati: II  TM Distance: >3 FB Neck ROM: full    Dental  (+) Teeth Intact   Pulmonary neg pulmonary ROS, sleep apnea    Pulmonary exam normal breath sounds clear to auscultation       Cardiovascular Exercise Tolerance: Good hypertension, Pt. on medications negative cardio ROS Normal cardiovascular exam Rhythm:Regular Rate:Normal     Neuro/Psych CVA, No Residual Symptoms negative neurological ROS  negative psych ROS   GI/Hepatic negative GI ROS, Neg liver ROS,,,  Endo/Other  negative endocrine ROSdiabetes, Type 2    Renal/GU negative Renal ROS  negative genitourinary   Musculoskeletal negative musculoskeletal ROS (+)    Abdominal Normal abdominal exam  (+)   Peds negative pediatric ROS (+)  Hematology negative hematology ROS (+)   Anesthesia Other Findings Past Medical History: No date: Cancer (HCC)     Comment:  Basal cell cancer of lt.lower leg No date: Diabetes mellitus without complication (HCC) No date: Elevated lipids No date: Enlarged prostate No date: Heartburn No date: Hypertension No date: Kidney stone No date: Motion sickness     Comment:  back seat of car, boat  Past Surgical History: 04/16/2022: CATARACT EXTRACTION W/PHACO; Right     Comment:  Procedure: CATARACT EXTRACTION PHACO AND INTRAOCULAR               LENS PLACEMENT (IOC) RIGHT DIABETIC 9.10 00:52.7;                Surgeon: Jaye Fallow, MD;  Location: [facility];  Service: Ophthalmology;  Laterality: Right;                Diabetic 10/14/2014: COLONOSCOPY; N/A     Comment:  Procedure: COLONOSCOPY;  Surgeon: Deward CINDERELLA Piedmont, MD;                Location: [facility];  Service: Gastroenterology;                 Laterality: N/A; 12/14/2018: HOLEP-LASER ENUCLEATION OF THE PROSTATE WITH MORCELLATION;  N/A     Comment:  Procedure: HOLEP-LASER ENUCLEATION OF THE PROSTATE WITH               MORCELLATION;  Surgeon: Penne Knee, MD;  Location:               [facility];  Service: Urology;  Laterality: N/A; No date: UMBILICAL HERNIA REPAIR  BMI    Body Mass Index: 25.10 kg/m      Reproductive/Obstetrics negative OB ROS                              Anesthesia Physical Anesthesia Plan  ASA: 3  Anesthesia Plan: General   Post-op Pain Management:    Induction: Intravenous  PONV Risk Score and Plan: Propofol  infusion and TIVA  Airway Management Planned: Natural Airway and Nasal Cannula  Additional Equipment:   Intra-op Plan:   Post-operative Plan:   Informed Consent: I have reviewed the patients History and Physical, chart, labs and discussed the procedure including the risks, benefits and alternatives for the proposed anesthesia with the patient or authorized representative who has indicated his/her understanding and acceptance.  Dental Advisory Given  Plan Discussed with: CRNA  Anesthesia Plan Comments:         Anesthesia Quick Evaluation

## 2024-02-13 NOTE — CV Procedure (Signed)
   TRANSESOPHAGEAL ECHOCARDIOGRAM  NAME:  Kristopher Christensen    MRN: [id number] DOB:  1950-12-19    ADMIT DATE: 02/11/2024  INDICATIONS: CVA  PROCEDURE:   Informed consent was obtained prior to the procedure. The risks, benefits and alternatives for the procedure were discussed and the patient comprehended these risks.  Risks include, but are not limited to, cough, sore throat, vomiting, nausea, somnolence, esophageal and stomach trauma or perforation, bleeding, low blood pressure, aspiration, pneumonia, infection, trauma to the teeth and death.    After a procedural time-out, the oropharynx was anesthetized and the patient was sedated by the anesthesia service. The transesophageal probe was inserted in the esophagus without difficulty and multiple views were obtained.  COMPLICATIONS:    Complications: No complications Patient tolerated procedure well.  KEY FINDINGS:  Grossly normal biventricular function without hemodynamically significant valvular disease.  No valvular/intracardiac masses, mobile atheromas, PFO/ASD, or other sources of cardiac emboli identified.  Full Report to follow.    Signed, Caron Poser, MD

## 2024-02-13 NOTE — Consult Note (Signed)
 NEUROLOGY CONSULT NOTE   Date of service: February 13, 2024 Patient Name: Kristopher Christensen MRN:  [id number] DOB:  [birth year] Chief Complaint: acute stroke Requesting Provider: Kandis Devaughn Sayres, MD  History of Present Illness  Kristopher Christensen is a 73 y.o. adult with hx of DM, HTN, HL who presented to ED for evaluation of gait instability and slurred speech x24 hrs. He was weak on his left side and incoordinated. His symptoms have improved and he currently only has NIHSS = 1 for dysarthria. 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   Additional stroke workup this admission:  CTA H&N  1. Negative for large vessel occlusion. No significant carotid plaque or stenosis. But positive for intracranial atherosclerosis including multifocal Moderate stenoses of Left MCA branches, and up to Severe stenosis of the left PCA P2 segment. But no Right MCA branch occlusion or significant stenosis identified.   2. Calcified plaque at the Right Vertebral Artery origin with Moderate stenosis.   3. Expected CT appearance of known Right hemisphere lacunar infarct. No intracranial hemorrhage or mass effect.  TTE  Left ventricular ejection fraction, by estimation, is 60 to 65% . The left ventricle has normal function. The left ventricle has no regional wall motion abnormalities. Left ventricular diastolic parameters are consistent with Grade I diastolic dysfunction ( impaired relaxation) . 2. Right ventricular systolic function is normal. The right ventricular size is normal. 3. The mitral valve is normal in structure. Trivial mitral valve regurgitation. No evidence of mitral stenosis. 4. The aortic valve is normal in structure. Aortic valve regurgitation is not visualized. No aortic stenosis is present. 5. The inferior vena cava is normal in size  with greater than 50% respiratory variability, suggesting right atrial pressure of 3 mmHg.  Stroke Labs     Component Value Date/Time   CHOL 175 02/12/2024 0532   TRIG 125 02/12/2024 0532   HDL 46 02/12/2024 0532   CHOLHDL 3.8 02/12/2024 0532   VLDL 25 02/12/2024 0532   LDLCALC 104 (H) 02/12/2024 0532    Lab Results  Component Value Date/Time   HGBA1C 6.8 (H) 02/11/2024 02:23 PM     LKW: 24 hrs PTA Modified rankin score: 0-Completely asymptomatic and back to baseline post- stroke NIHSS = 1 for dysarthria   ROS   Comprehensive ROS performed and pertinent positives documented in HPI   Past History   Past Medical History:  Diagnosis Date   Cancer (HCC)    Basal cell cancer of lt.lower leg   Diabetes mellitus without complication (HCC)    Elevated lipids    Enlarged prostate    Heartburn    Hypertension    Kidney stone    Motion sickness    back seat of car, boat    Past Surgical History:  Procedure Laterality Date   CATARACT EXTRACTION W/PHACO Right 04/16/2022   Procedure: CATARACT EXTRACTION PHACO AND INTRAOCULAR LENS PLACEMENT (IOC) RIGHT DIABETIC 9.10 00:52.7;  Surgeon: Jaye Fallow, MD;  Location: [facility];  Service: Ophthalmology;  Laterality: Right;  Diabetic   COLONOSCOPY N/A 10/14/2014   Procedure: COLONOSCOPY;  Surgeon: Deward CINDERELLA Piedmont, MD;  Location: [facility] ENDOSCOPY;  Service: Gastroenterology;  Laterality: N/A;   HOLEP-LASER ENUCLEATION OF THE PROSTATE WITH MORCELLATION N/A 12/14/2018   Procedure: HOLEP-LASER ENUCLEATION OF THE PROSTATE WITH MORCELLATION;  Surgeon: Penne Knee, MD;  Location: [facility]  ORS;  Service: Urology;  Laterality: N/A;   UMBILICAL HERNIA REPAIR      Family History: Family History  Problem Relation Age of Onset   Prostate cancer Neg Hx    Kidney disease Neg Hx    Bladder Cancer Neg Hx     Social History  reports that she has never smoked. She has never used smokeless tobacco. She reports current alcohol use of about  5.0 standard drinks of alcohol per week. She reports that she does not use drugs.  No Known Allergies  Medications   Current Facility-Administered Medications:    0.9 %  sodium chloride  infusion, , Intravenous, Continuous, Gollan, Timothy J, MD, Last Rate: 20 mL/hr at 02/13/24 0030, Infusion Verify at 02/13/24 0030   acetaminophen  (TYLENOL ) tablet 650 mg, 650 mg, Oral, Q4H PRN **OR** acetaminophen  (TYLENOL ) 160 MG/5ML solution 650 mg, 650 mg, Per Tube, Q4H PRN **OR** acetaminophen  (TYLENOL ) suppository 650 mg, 650 mg, Rectal, Q4H PRN, Paudel, Keshab, MD   aspirin  EC tablet 81 mg, 81 mg, Oral, QPM, Paudel, Keshab, MD, 81 mg at 02/12/24 1723   atorvastatin  (LIPITOR) tablet 80 mg, 80 mg, Oral, Daily, Wouk, Devaughn Sayres, MD, 80 mg at 02/12/24 1256   enoxaparin  (LOVENOX ) injection 40 mg, 40 mg, Subcutaneous, Q24H, Paudel, Keshab, MD, 40 mg at 02/12/24 1722   insulin  aspart (novoLOG ) injection 0-5 Units, 0-5 Units, Subcutaneous, QHS, Paudel, Keshab, MD, 3 Units at 02/12/24 2110   insulin  aspart (novoLOG ) injection 0-9 Units, 0-9 Units, Subcutaneous, TID WC, Paudel, Keshab, MD, 1 Units at 02/12/24 1707   senna-docusate (Senokot-S) tablet 1 tablet, 1 tablet, Oral, QHS PRN, Roann Gouty, MD  Vitals   Vitals:   02/12/24 1607 02/12/24 2005 02/12/24 2343 02/13/24 0425  BP: (!) 157/79 (!) 147/73 (!) 142/61 (!) 146/79  Pulse: (!) 55 (!) 54 (!) 53 (!) 46  Resp: 16 18 18 18   Temp: 98.3 F (36.8 C) 97.7 F (36.5 C) 98 F (36.7 C) 98.8 F (37.1 C)  TempSrc: Oral Oral Oral   SpO2: 97% 98% 99% 97%  Weight:      Height:        Body mass index is 25.1 kg/m.   Physical Exam   Gen: patient lying in bed, NAD CV: extremities appear well-perfused Resp: normal WOB  Neurologic exam MS: alert, oriented x4, follows commands Speech: no dysarthria, no aphasia CN: PERRL, VFF, EOMI, sensation intact, face symmetric, hearing intact to voice Motor: 5/5 strength throughout Sensory: SILT Reflexes: 2+  symm with toes down bilat Coordination: FNF intact bilat Gait: deferred   Labs/Imaging/Neurodiagnostic studies   CBC:  Recent Labs  Lab March 09, 2024 1053 03-09-2024 1423 02/12/24 0532  WBC 5.7 6.3 4.8  NEUTROABS 3.5  --   --   HGB 13.6 13.5 13.2  HCT 38.4* 39.3 37.5*  MCV 86.9 87.1 86.6  PLT 215 207 201   Basic Metabolic Panel:  Lab Results  Component Value Date   NA 141 March 09, 2024   K 3.8 03-09-2024   CO2 25 2024/03/09   GLUCOSE 138 (H) 03-09-2024   BUN 15 09-Mar-2024   CREATININE 1.13 March 09, 2024   CALCIUM  8.9 March 09, 2024   GFRNONAA >60 03-09-2024   Lipid Panel:  Lab Results  Component Value Date   LDLCALC 104 (H) 02/12/2024   HgbA1c:  Lab Results  Component Value Date   HGBA1C 6.8 (H) 09-Mar-2024   Urine Drug Screen: No results found for: LABOPIA, COCAINSCRNUR, LABBENZ, AMPHETMU, THCU, LABBARB  Alcohol Level     Component  Value Date/Time   [facility] <15 02/11/2024 1053   INR  Lab Results  Component Value Date   INR 1.0 02/11/2024   APTT  Lab Results  Component Value Date   APTT 29 02/11/2024   AED levels: No results found for: PHENYTOIN, ZONISAMIDE, LAMOTRIGINE, LEVETIRACETA   MRI Brain(Personally reviewed): 1. 2.5 cm acute ischemic perforator type infarct involving the right basal ganglia. 2. Additional subcentimeter acute ischemic nonhemorrhagic infarct involving the high parafalcine left frontal lobe. 3. Underlying mild to moderate chronic microvascular ischemic disease with remote lacunar infarct at the posterior right basal ganglia.  CTA H&N  1. Negative for large vessel occlusion. No significant carotid plaque or stenosis. But positive for intracranial atherosclerosis including multifocal Moderate stenoses of Left MCA branches, and up to Severe stenosis of the left PCA P2 segment. But no Right MCA branch occlusion or significant stenosis identified.   2. Calcified plaque at the Right Vertebral Artery origin with Moderate  stenosis.   3. Expected CT appearance of known Right hemisphere lacunar infarct. No intracranial hemorrhage or mass effect.  TTE  Left ventricular ejection fraction, by estimation, is 60 to 65% . The left ventricle has normal function. The left ventricle has no regional wall motion abnormalities. Left ventricular diastolic parameters are consistent with Grade I diastolic dysfunction ( impaired relaxation) . 2. Right ventricular systolic function is normal. The right ventricular size is normal. 3. The mitral valve is normal in structure. Trivial mitral valve regurgitation. No evidence of mitral stenosis. 4. The aortic valve is normal in structure. Aortic valve regurgitation is not visualized. No aortic stenosis is present. 5. The inferior vena cava is normal in size with greater than 50% respiratory variability, suggesting right atrial pressure of 3 mmHg.  Stroke Labs     Component Value Date/Time   CHOL 175 02/12/2024 0532   TRIG 125 02/12/2024 0532   HDL 46 02/12/2024 0532   CHOLHDL 3.8 02/12/2024 0532   VLDL 25 02/12/2024 0532   LDLCALC 104 (H) 02/12/2024 0532    Lab Results  Component Value Date/Time   HGBA1C 6.8 (H) 02/11/2024 02:23 PM    ASSESSMENT   Kristopher Christensen is a 73 y.o. adult with hx of DM, HTN, HL who presented to ED for evaluation of dysarthria, L sided weakness, and gait instability x24 hours. His sx have resolved except dysarthria. He was found to have acute ischemic infarcts bilaterally c/f central embolic source. Recommend TEE for further evaluation.  RECOMMENDATIONS   - Goal normotension, avoid hypotension - TEE tmrw - Atorvastatin  20mg  - ASA 81mg  daily + plavix  75mg  daily x90 days f/b plavix  daily monotherapy after that - q4 hr neuro checks - STAT head CT for any change in neuro exam - Tele - PT/OT/SLP - Stroke education - Amb referral to neurology upon discharge   Will continue to  follow.  ______________________________________________________________________    Signed, Elida CHRISTELLA Ross, MD [facility]

## 2024-02-13 NOTE — Plan of Care (Signed)
  Problem: Ischemic Stroke/TIA Tissue Perfusion: Goal: Complications of ischemic stroke/TIA will be minimized Outcome: Progressing   Problem: Coping: Goal: Will verbalize positive feelings about self Outcome: Progressing   Problem: Self-Care: Goal: Ability to participate in self-care as condition permits will improve Outcome: Progressing   Problem: Safety: Goal: Ability to remain free from injury will improve Outcome: Progressing   Problem: Pain Managment: Goal: General experience of comfort will improve and/or be controlled Outcome: Progressing

## 2024-02-13 NOTE — Discharge Summary (Signed)
 Kristopher Christensen FMW:[id number] DOB: 06-12-1950 DOA: 02/11/2024  PCP: Alla Amis, MD  Admit date: 02/11/2024 Discharge date: 02/13/2024  Time spent: 35 minutes  Recommendations for Outpatient Follow-up:  Pcp f/u 1 week Neurology f/u (referral placed) Outpatient PT (order placed) Re-eval and/or treat OSA    Discharge Diagnoses:  Principal Problem:   Acute CVA (cerebrovascular accident) (HCC) Active Problems:   Pure hypercholesterolemia   Controlled type 2 diabetes mellitus without complication, without long-term current use of insulin  (HCC)   HLD (hyperlipidemia)   Discharge Condition: improved  Diet recommendation: heart healthy  Filed Weights   02/11/24 1052 02/13/24 0713  Weight: 81.6 kg 81.6 kg    History of present illness:  From admission h and p Kristopher Christensen is a pleasant 73 y.o. adult with medical history significant for DM, HTN, HLD who came into the ED at [facility] for evaluation of slurred speech and gait instability.  Patient stated that yesterday afternoon he noticed unstable gait when he was trying to walk.  He does not typically require any assistace but yesterday he was weak.  His wife noticed that he was slurred and possible facial weakness.  He required help to walk around.  Patient's wife stated that he did not want to come to the hospital yesterday.  Brought in to the hospital today.  Patient denies any history of prior stroke, denies any numbness or tingling or focal weaknesses.  Patient denies any fever chills nausea vomiting abdominal pain chest pain shortness of breath or palpitations.   Hospital Course:   # Acute CVA MRI shows two acute strokes on right basal ganglia and there other left frontal. Concern then for central etiology. TTE negative, CTA neg for large vessel disease. Ldl 104. Symptoms mainly gait dysfunction and difficulty with coordination, much improved. Sinus rhythm on EKG and tele. TEE unremarkable. Central etiology remains possible.  - PT  advising outpatient PT, order placed - 14 day event monitor placed by cardiology - referred to neurology - atorva 40 substituting home statin - dapt for 90 days followed by plavix  monotherapy (was on asa prior to this event)   # T2DM Well controlled A1c 6.8%   # HTN Normotension now goal. Mild elevation here - resume home bp meds   # OSA Not treated - advise outpt f/u, treating if still present  Procedures: TEE   Consultations: Cardiology, neurology  Discharge Exam: Vitals:   02/13/24 0910 02/13/24 1238  BP: (!) 141/76 (!) 149/77  Pulse: (!) 50 (!) 56  Resp: 16 16  Temp: (!) 97.5 F (36.4 C) 98.5 F (36.9 C)  SpO2: 97% 99%    General: NAD Cardiovascular: RRR Respiratory: CTAB Neuro: non-focal  Discharge Instructions   Discharge Instructions     Ambulatory referral to Neurology   Complete by: As directed    Diet - low sodium heart healthy   Complete by: As directed    Increase activity slowly   Complete by: As directed       Allergies as of 02/13/2024   No Known Allergies      Medication List     STOP taking these medications    lovastatin 20 MG tablet Commonly known as: MEVACOR   tadalafil  20 MG tablet Commonly known as: CIALIS        TAKE these medications    aspirin  81 MG tablet Take 1 tablet (81 mg total) by mouth every evening.   atorvastatin  40 MG tablet Commonly known as: Lipitor Take 1 tablet (40  mg total) by mouth daily.   chlorthalidone 25 MG tablet Commonly known as: HYGROTON Take 25 mg by mouth daily.   clopidogrel  75 MG tablet Commonly known as: PLAVIX  Take 1 tablet (75 mg total) by mouth daily.   glimepiride 2 MG tablet Commonly known as: AMARYL Take 2 mg by mouth daily with breakfast.   lisinopril  30 MG tablet Commonly known as: ZESTRIL  Take 30 mg by mouth at bedtime.   metFORMIN 1000 MG tablet Commonly known as: GLUCOPHAGE Take 1,000-1,500 mg by mouth See admin instructions. Take 1000 mg by mouth in the  morning and take 1500 mg by mouth in the evening with meals   MULTIVITAMIN ADULT PO Take 1 tablet by mouth daily.   sildenafil  20 MG tablet Commonly known as: REVATIO  TAKE 1 TO 5 TABLETS BY MOUTH AS NEEDED 1  HOUR  PRIOR  TO  INTERCOURSE       No Known Allergies  Follow-up Information     Alla Amis, MD Follow up.   Specialty: Family Medicine Why: hospital follow up Contact information: [address] [facility] [place] [address] [phone]                  The results of significant diagnostics from this hospitalization (including imaging, microbiology, ancillary and laboratory) are listed below for reference.    Significant Diagnostic Studies: ECHO TEE Result Date: 02/13/2024    TRANSESOPHOGEAL ECHO REPORT   Patient Name:   Kristopher Christensen Date of Exam: 02/13/2024 Medical Rec #:  [id number]     Height:       71.0 in Accession #:    [id number]    Weight:       180.0 lb Date of Birth:  April 03, 1951     BSA:          2.016 m Patient Age:    73 years      BP:           146/79 mmHg Patient Gender: M             HR:           46 bpm. Exam Location:  [facility] Procedure: Transesophageal Echo, Cardiac Doppler, Color Doppler and Saline            Contrast Bubble Study (Both Spectral and Color Flow Doppler were            utilized during procedure). Indications:     Cerebral infarction, unspecified I63.9  History:         Patient has prior history of Echocardiogram examinations, most                  recent 02/12/2024. Risk Factors:Hypertension and Diabetes.  Sonographer:     Christopher Furnace Referring Phys:  [id number] Kristopher Christensen Diagnosing Phys: Caron Poser PROCEDURE: After discussion of the risks and benefits of a TEE, an informed consent was obtained from the patient. The transesophogeal probe was passed without difficulty through the esophogus of the patient. Sedation performed by different physician. The patient developed no complications during the procedure.   IMPRESSIONS  1. Left ventricular ejection fraction, by estimation, is 60 to 65%. The left ventricle has normal function.  2. Right ventricular systolic function is normal.  3. No left atrial/left atrial appendage thrombus was detected. The LAA emptying velocity was 60 cm/s.  4. Agitated saline contrast bubble study was negative, with no evidence of any interatrial shunt. Conclusion(s)/Recommendation(s): Normal biventricular function without  evidence of hemodynamically significant valvular heart disease. No LA/LAA thrombus identified. Negative bubble study for interatrial shunt. No intracardiac source of embolism detected  on this on this transesophageal echocardiogram. FINDINGS  Left Ventricle: Left ventricular ejection fraction, by estimation, is 60 to 65%. The left ventricle has normal function. The left ventricular internal cavity size was normal in size. Right Ventricle: The right ventricular size is not well visualized. Right vetricular wall thickness was not assessed. Right ventricular systolic function is normal. Left Atrium: Left atrial size was normal in size. No left atrial/left atrial appendage thrombus was detected. The LAA emptying velocity was 60 cm/s. Right Atrium: Right atrial size was not assessed. Pericardium: There is no evidence of pericardial effusion. Mitral Valve: The mitral valve is normal in structure. Trivial mitral valve regurgitation. No evidence of mitral valve stenosis. Tricuspid Valve: The tricuspid valve is normal in structure. Tricuspid valve regurgitation is not demonstrated. No evidence of tricuspid stenosis. Aortic Valve: Focal calcification of the right coronary cusp. The aortic valve is tricuspid. There is mild calcification of the aortic valve. There is mild thickening of the aortic valve. Aortic valve regurgitation is trivial. No aortic stenosis is present. Pulmonic Valve: The pulmonic valve was normal in structure. Pulmonic valve regurgitation is not visualized. No evidence  of pulmonic stenosis. Aorta: The aortic root and ascending aorta are structurally normal, with no evidence of dilitation. There is minimal (Grade I) plaque. IAS/Shunts: No atrial level shunt detected by color flow Doppler. Agitated saline contrast was given intravenously to evaluate for intracardiac shunting. Agitated saline contrast bubble study was negative, with no evidence of any interatrial shunt. Caron Poser Electronically signed by Caron Poser Signature Date/Time: 02/13/2024/8:23:35 AM    Final    CT ANGIO HEAD NECK W WO CM Result Date: 02/12/2024 CLINICAL DATA:  73 year old male with right basal ganglia lacunar infarct on MRI yesterday. EXAM: CT ANGIOGRAPHY HEAD AND NECK WITH AND WITHOUT CONTRAST TECHNIQUE: Multidetector CT imaging of the head and neck was performed using the standard protocol during bolus administration of intravenous contrast. Multiplanar CT image reconstructions and MIPs were obtained to evaluate the vascular anatomy. Carotid stenosis measurements (when applicable) are obtained utilizing NASCET criteria, using the distal internal carotid diameter as the denominator. RADIATION DOSE REDUCTION: This exam was performed according to the departmental dose-optimization program which includes automated exposure control, adjustment of the mA and/or kV according to patient size and/or use of iterative reconstruction technique. CONTRAST:  75mL OMNIPAQUE  IOHEXOL  350 MG/ML SOLN COMPARISON:  Head CT and brain MRI yesterday. FINDINGS: CT HEAD Brain: Slightly increased conspicuity of hypodensity corresponding to right corona radiata and posterior lentiform lacunar infarct on MRI. No acute intracranial hemorrhage identified. No midline shift, mass effect, or evidence of intracranial mass lesion. No cortically based acute infarct identified. Calvarium and skull base: Stable. Paranasal sinuses: Visualized paranasal sinuses and mastoids are stable and well aerated. Orbits: Stable orbit and scalp soft  tissues. CTA NECK Skeleton: Ordinary cervical spine degeneration. No acute osseous abnormality identified. Upper chest: Negative. Other neck: Nonvascular neck soft tissue spaces appear negative. Aortic arch: 3 vessel arch.  Minimal arch atherosclerosis. Right carotid system: Patent with mild pulsation artifact at the thoracic inlet. Minimal atherosclerosis at the right ICA origin. Tortuous right ICA distal to the bulb. No stenosis. Left carotid system: Patent with minimal atherosclerosis and no stenosis. Mild left ICA tortuosity. Vertebral arteries: Proximal right subclavian artery mild tortuosity. Calcified plaque at the right vertebral artery origin with mild to moderate stenosis (series 13,  image 200). Patent right vertebral to the skull base with no additional plaque or stenosis. Proximal left subclavian artery calcified plaque without stenosis. Calcified plaque near the left vertebral artery origin but no stenosis (series 13, image 199). Tortuous left V1 segment. Codominant left vertebral artery is patent without stenosis to the skull base. CTA HEAD Posterior circulation: Codominant distal vertebral arteries with normal PICA origins, normal vertebrobasilar junction. Patent basilar artery without stenosis. Normal SCA origins. Patent PCA origins. Small left posterior communicating artery, the right is diminutive or absent. Severe stenosis left PCA P1 segment with maintained distal patency. Mild P3 branch irregularity on that side. Right PCA mild P1 through P3 segment irregularity, no significant right PCA stenosis. Anterior circulation: Both ICA siphons are patent. Minimal left siphon plaque without stenosis. Minimal right siphon plaque without stenosis. Normal posterior communicating artery origins. Patent carotid termini. Normal MCA and ACA origins. Dominant right ACA A1 segment. Normal anterior communicating artery. ACA branches are within normal limits. Left MCA M1 segment bifurcates early without stenosis.  Moderate stenosis superior left M2 division on series 16, image 24. And multifocal moderate additional irregularity and stenoses of posterior left M2 and M3 divisions on series 17, image 30. Contralateral right MCA M1 segment is patent with mild irregularity. Right MCA bifurcation is patent without stenosis. Right MCA branches are patent with only mild irregularity. Venous sinuses: Patent. Anatomic variants: Dominant right ACA A1. Review of the MIP images confirms the above findings IMPRESSION: 1. Negative for large vessel occlusion. No significant carotid plaque or stenosis. But positive for intracranial atherosclerosis including multifocal Moderate stenoses of Left MCA branches, and up to Severe stenosis of the left PCA P2 segment. But no Right MCA branch occlusion or significant stenosis identified. 2. Calcified plaque at the Right Vertebral Artery origin with Moderate stenosis. 3. Expected CT appearance of known Right hemisphere lacunar infarct. No intracranial hemorrhage or mass effect. Electronically Signed   By: VEAR Hurst M.D.   On: 02/12/2024 13:59   ECHOCARDIOGRAM COMPLETE Result Date: 02/12/2024    ECHOCARDIOGRAM REPORT   Patient Name:   TEIGE ROUNTREE Date of Exam: 02/11/2024 Medical Rec #:  [id number]     Height:       71.0 in Accession #:    [id number]    Weight:       180.0 lb Date of Birth:  [birth year]     BSA:          2.016 m Patient Age:    73 years      BP:           187/93 mmHg Patient Gender: M             HR:           59 bpm. Exam Location:  [facility] Procedure: 2D Echo, Cardiac Doppler and Color Doppler (Both Spectral and Color            Flow Doppler were utilized during procedure). Indications:     G45.9 TIA  History:         Patient has no prior history of Echocardiogram examinations.                  Risk Factors:Diabetes and Hypertension.  Sonographer:     Carl Coma RDCS Referring Phys:  [id number] [facility] Diagnosing Phys: Marsa Dooms MD IMPRESSIONS  1. Left ventricular  ejection fraction, by estimation, is 60 to 65%. The left ventricle has normal function. The left ventricle has no regional  wall motion abnormalities. Left ventricular diastolic parameters are consistent with Grade I diastolic dysfunction (impaired relaxation).  2. Right ventricular systolic function is normal. The right ventricular size is normal.  3. The mitral valve is normal in structure. Trivial mitral valve regurgitation. No evidence of mitral stenosis.  4. The aortic valve is normal in structure. Aortic valve regurgitation is not visualized. No aortic stenosis is present.  5. The inferior vena cava is normal in size with greater than 50% respiratory variability, suggesting right atrial pressure of 3 mmHg. FINDINGS  Left Ventricle: Left ventricular ejection fraction, by estimation, is 60 to 65%. The left ventricle has normal function. The left ventricle has no regional wall motion abnormalities. Strain was performed and the global longitudinal strain is indeterminate. The left ventricular internal cavity size was normal in size. There is no left ventricular hypertrophy. Left ventricular diastolic parameters are consistent with Grade I diastolic dysfunction (impaired relaxation). Right Ventricle: The right ventricular size is normal. No increase in right ventricular wall thickness. Right ventricular systolic function is normal. Left Atrium: Left atrial size was normal in size. Right Atrium: Right atrial size was normal in size. Pericardium: There is no evidence of pericardial effusion. Mitral Valve: The mitral valve is normal in structure. Trivial mitral valve regurgitation. No evidence of mitral valve stenosis. Tricuspid Valve: The tricuspid valve is normal in structure. Tricuspid valve regurgitation is trivial. No evidence of tricuspid stenosis. Aortic Valve: The aortic valve is normal in structure. Aortic valve regurgitation is not visualized. No aortic stenosis is present. Pulmonic Valve: The pulmonic valve  was normal in structure. Pulmonic valve regurgitation is not visualized. No evidence of pulmonic stenosis. Aorta: The aortic root is normal in size and structure. Venous: The inferior vena cava is normal in size with greater than 50% respiratory variability, suggesting right atrial pressure of 3 mmHg. IAS/Shunts: No atrial level shunt detected by color flow Doppler. Additional Comments: 3D was performed not requiring image post processing on an independent workstation and was indeterminate.  LEFT VENTRICLE PLAX 2D LVIDd:         5.00 cm   Diastology LVIDs:         2.90 cm   LV e' medial:    6.36 cm/s LV PW:         0.80 cm   LV E/e' medial:  12.1 LV IVS:        0.80 cm   LV e' lateral:   7.50 cm/s LVOT diam:     2.10 cm   LV E/e' lateral: 10.3 LV SV:         105 LV SV Index:   52 LVOT Area:     3.46 cm  RIGHT VENTRICLE             IVC RV Basal diam:  5.10 cm     IVC diam: 1.50 cm RV S prime:     15.10 cm/s TAPSE (M-mode): 2.6 cm LEFT ATRIUM             Index        RIGHT ATRIUM           Index LA diam:        4.30 cm 2.13 cm/m   RA Area:     14.00 cm LA Vol (A2C):   61.0 ml 30.25 ml/m  RA Volume:   34.90 ml  17.31 ml/m LA Vol (A4C):   46.2 ml 22.91 ml/m LA Biplane Vol: 55.7 ml 27.63 ml/m  AORTIC  VALVE LVOT Vmax:   125.00 cm/s LVOT Vmean:  82.100 cm/s LVOT VTI:    0.302 m  AORTA Ao Root diam: 4.10 cm Ao Asc diam:  4.00 cm MITRAL VALVE MV Area (PHT): 3.21 cm    SHUNTS MV Decel Time: 236 msec    Systemic VTI:  0.30 m MV E velocity: 77.00 cm/s  Systemic Diam: 2.10 cm MV A velocity: 88.00 cm/s MV E/A ratio:  0.88 Marsa Dooms MD Electronically signed by Marsa Dooms MD Signature Date/Time: 02/12/2024/1:25:16 PM    Final    MR BRAIN WO CONTRAST Result Date: 02/11/2024 CLINICAL DATA:  Initial evaluation for acute TIA. EXAM: MRI HEAD WITHOUT CONTRAST TECHNIQUE: Multiplanar, multiecho pulse sequences of the brain and surrounding structures were obtained without intravenous contrast. COMPARISON:  Prior  CT from earlier the same day. FINDINGS: Brain: Limits cerebral volume within normal limits. Scattered patchy T2/FLAIR hyperintensity involving the periventricular deep white matter both cerebral hemispheres, consistent with chronic small vessel ischemic disease, mild to moderate in nature. Remote lacunar infarct present at the posterior right basal ganglia. 2.5 cm focus of restricted diffusion involving the right basal ganglia, consistent with an acute ischemic perforator type infarct. Additional subcentimeter acute ischemic infarcts noted involving the high parafalcine left frontal lobe (series 5, image 44). No associated hemorrhage or mass effect about these infarcts. Gray-white matter differentiation otherwise maintained. No areas of chronic cortical infarction. No acute intracranial hemorrhage. Few chronic micro hemorrhages noted, likely small vessel related. No mass lesion, midline shift or mass effect. No hydrocephalus or extra-axial fluid collection. Pituitary gland within normal limits. Vascular: Major intracranial vascular flow voids are maintained. Skull and upper cervical spine: Craniocervical junction within normal limits. Bone marrow signal intensity normal. No scalp soft tissue abnormality. Sinuses/Orbits: Prior ocular lens replacement on the right. Mild scattered mucosal thickening present about the ethmoidal air cells. Paranasal sinuses are otherwise clear. Trace right mastoid effusion noted, of doubtful significance. Other: None. IMPRESSION: 1. 2.5 cm acute ischemic perforator type infarct involving the right basal ganglia. 2. Additional subcentimeter acute ischemic nonhemorrhagic infarct involving the high parafalcine left frontal lobe. 3. Underlying mild to moderate chronic microvascular ischemic disease with remote lacunar infarct at the posterior right basal ganglia. Electronically Signed   By: Morene Hoard M.D.   On: 02/11/2024 22:12   CT HEAD WO CONTRAST Result Date:  02/11/2024 EXAM: CT HEAD WITHOUT CONTRAST 02/11/2024 11:23:31 AM TECHNIQUE: CT of the head was performed without the administration of intravenous contrast. Automated exposure control, iterative reconstruction, and/or weight based adjustment of the mA/kV was utilized to reduce the radiation dose to as low as reasonably achievable. COMPARISON: None available. CLINICAL HISTORY: Neuro deficit, acute, stroke suspected. Slurred speech and dizziness. States he is having difficulty with coordination. LWK 1200 yesterday. Wife reports a facial droop yesterday. FINDINGS: BRAIN AND VENTRICLES: Nonspecific hypoattenuation in the periventricular and subcortical white matter, most likely representing chronic small vessel disease. 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 No acute hemorrhage. No evidence of acute infarct. No hydrocephalus. No extra-axial collection. No mass effect or midline shift. ORBITS: No acute abnormality. SINUSES: No acute abnormality. SOFT TISSUES AND SKULL: No acute soft tissue abnormality. No skull fracture. Atherosclerosis of the carotid siphons. Right lens replacement. IMPRESSION: 1. No acute intracranial abnormality. 2. Mild chronic small vessel disease. 3. Remote infarct in the right corona radiata extending into the posterior aspect of the  lentiform nucleus. Remote infarct in the left corona radiata. Electronically signed by: Donnice Mania MD 02/11/2024 11:47 AM EDT RP Workstation: [id number]    Microbiology: No results found for this or any previous visit (from the past 240 hours).   Labs: Basic Metabolic Panel: Recent Labs  Lab 02/11/24 1053 02/11/24 1423 02/11/24 1638 02/12/24 0532  NA 139  --  141  --   K 3.8  --  3.8  --   CL 104  --  107  --   CO2 24  --  25  --   GLUCOSE 237*  --  138*  --   BUN 15  --  15  --   CREATININE 1.07 1.08 1.13  --   CALCIUM  9.1  --   8.9  --   MG  --   --   --  2.1   Liver Function Tests: Recent Labs  Lab 02/11/24 1053 02/11/24 1638  AST 29 21  ALT 21 19  ALKPHOS 70 65  BILITOT 0.7 0.7  PROT 6.7 6.7  ALBUMIN 4.2 4.2   No results for input(s): LIPASE, AMYLASE in the last 168 hours. No results for input(s): AMMONIA in the last 168 hours. CBC: Recent Labs  Lab 02/11/24 1053 02/11/24 1423 02/12/24 0532  WBC 5.7 6.3 4.8  NEUTROABS 3.5  --   --   HGB 13.6 13.5 13.2  HCT 38.4* 39.3 37.5*  MCV 86.9 87.1 86.6  PLT 215 207 201   Cardiac Enzymes: No results for input(s): CKTOTAL, CKMB, CKMBINDEX, TROPONINI in the last 168 hours. BNP: BNP (last 3 results) No results for input(s): BNP in the last 8760 hours.  ProBNP (last 3 results) No results for input(s): PROBNP in the last 8760 hours.  CBG: Recent Labs  Lab 02/12/24 0754 02/12/24 1203 02/12/24 1633 02/12/24 2007 02/13/24 1149  GLUCAP 153* 208* 140* 261* 127*       Signed:  Devaughn KATHEE Ban MD.  [facility] 02/13/2024, 1:08 PM

## 2024-02-16 ENCOUNTER — Other Ambulatory Visit: Payer: Self-pay

## 2024-02-16 DIAGNOSIS — Z125 Encounter for screening for malignant neoplasm of prostate: Secondary | ICD-10-CM

## 2024-03-08 DIAGNOSIS — I639 Cerebral infarction, unspecified: Secondary | ICD-10-CM

## 2024-03-08 NOTE — Addendum Note (Signed)
 Encounter addended by: Cristopher Olivia PARAS, RN on: 03/08/2024 4:45 PM  Actions taken: Imaging Exam ended

## 2024-03-09 ENCOUNTER — Ambulatory Visit: Payer: Self-pay | Admitting: Nurse Practitioner

## 2024-03-14 NOTE — Progress Notes (Unsigned)
 Electrophysiology Clinic Note    Date:  03/15/2024  Patient ID:  Kristopher, Christensen Aug 20, 1950, MRN [id number] PCP:  Alla Amis, MD  Cardiologist:  None  Electrophysiology APP:  Bentlee Drier, NP    Discussed the use of AI scribe software for clinical note transcription with the patient, who gave verbal consent to proceed.   Patient Profile    Chief Complaint: cryptogenic stroke  History of Present Illness: ZAYD BONET is a 73 y.o. adult with PMH notable for CVA, HTN, T2DM; seen today for electrophysiology consult after recent CVA.   He presented to ER 02/2024 with gait instability, slurred speech, L-sided weakness. MRI brain revealed acute infarct. TTE and TEE normal. He wore a 2 week monitor at discharge that did not show AFib.   On follow-up today, he is doing well and continuing to heal from his recent CVA. Continues to have deficits with balance and swallowing at times, but overall healing well. He denies chest pain, chest pressure, palpitations.  His MIL had AFib and died from CVA.     Arrhythmia/Device History No specialty comments available.    ROS:  Please see the history of present illness. All other systems are reviewed and otherwise negative.    Physical Exam    VS:  BP 132/68 (BP Location: Left Arm, Patient Position: Sitting, Cuff Size: Normal)   Pulse 77   Ht 5' 11 (1.803 m)   Wt 180 lb 9.6 oz (81.9 kg)   SpO2 96%   BMI 25.19 kg/m  BMI: Body mass index is 25.19 kg/m.           Wt Readings from Last 3 Encounters:  03/15/24 180 lb 9.6 oz (81.9 kg)  02/13/24 179 lb 14.3 oz (81.6 kg)  04/16/22 187 lb (84.8 kg)     GEN- The patient is well appearing, alert and oriented x 3 today.   Lungs- Clear to ausculation bilaterally, normal work of breathing.  Heart- Regular rate and rhythm, no murmurs, rubs or gallops Extremities- No peripheral edema, warm, dry   Studies Reviewed   Previous EP, cardiology notes.    EKG is ordered.  Personal review of EKG from today shows:    EKG Interpretation Date/Time:  Monday March 15 2024 10:23:24 EDT Ventricular Rate:  77 PR Interval:  154 QRS Duration:  130 QT Interval:  406 QTC Calculation: 459 R Axis:   5  Text Interpretation: Normal sinus rhythm Right bundle branch block Confirmed by Richardo Popoff [phone]) on 03/15/2024 10:41:17 AM    All EKGs reviewed - no AFib   Long term ambulatory monitor, 03/08/2024 The monitor revealed predominantly sinus rhythm with mean HR of 68 bpm (range 44 - 127).    There were rare PACs and rare supraventricular couplets/triplets.  There were rare PVCs and rare ventricular couplets/triplets.  No patient triggers were recorded.  There were no high-grade arrhythmias seen.  There was a single 9-beat salvo of SVT (max HR 140bpm), likely pAT.  There was a a single 4-beat episode of NSVT (max HR 158bpm).  TEE,  02/13/2024  1. Left ventricular ejection fraction, by estimation, is 60 to 65%. The left ventricle has normal function.   2. Right ventricular systolic function is normal.   3. No left atrial/left atrial appendage thrombus was detected. The LAA emptying velocity was 60 cm/s.   4. Agitated saline contrast bubble study was negative, with no evidence of any interatrial shunt.   Conclusion(s)/Recommendation(s): Normal biventricular function without  evidence of hemodynamically significant valvular heart disease. No LA/LAA thrombus identified. Negative bubble study for interatrial shunt. No intracardiac source of embolism detected on this on this transesophageal echocardiogram.   TTE, 02/11/2024  1. Left ventricular ejection fraction, by estimation, is 60 to 65%. The left ventricle has normal function. The left ventricle has no regional wall motion abnormalities. Left ventricular diastolic parameters are consistent with Grade I diastolic dysfunction (impaired relaxation).   2. Right ventricular systolic function is normal. The right ventricular  size is normal.   3. The mitral valve is normal in structure. Trivial mitral valve regurgitation. No evidence of mitral stenosis.   4. The aortic valve is normal in structure. Aortic valve regurgitation is not visualized. No aortic stenosis is present.   5. The inferior vena cava is normal in size with greater than 50% respiratory variability, suggesting right atrial pressure of 3 mmHg.    Assessment and Plan     #) cryptogenic stroke Patient presented to [facility] recently with CVA Workup as to a cause as been unrevealing thus far Recent 2 week zio did not show AFib I spoke at length with the patient about monitoring for afib with an implantable loop recorder, including monthly monitor fees which may range from $0-$40.  Risks, benefits, and alteratives to implantable loop recorder were discussed with the patient today.  At this time, the patient is very clear in their decision to proceed with implantable loop recorder.  Will send message to precert team, and schedule appt with Dr. Kennyth for implant       Current medicines are reviewed at length with the patient today.   The patient does not have concerns regarding her medicines.  The following changes were made today:  none  Labs/ tests ordered today include:  Orders Placed This Encounter  Procedures   EKG 12-Lead     Disposition: Follow up with Dr. Kennyth for ILR implant    Signed, Chantal Needle, NP  03/15/24  11:04 AM  Electrophysiology [facility]

## 2024-03-15 ENCOUNTER — Encounter: Payer: Self-pay | Admitting: Cardiology

## 2024-03-15 ENCOUNTER — Ambulatory Visit: Attending: Cardiology | Admitting: Cardiology

## 2024-03-15 VITALS — BP 132/68 | HR 77 | Ht 71.0 in | Wt 180.6 lb

## 2024-03-15 DIAGNOSIS — I639 Cerebral infarction, unspecified: Secondary | ICD-10-CM

## 2024-03-15 NOTE — Patient Instructions (Addendum)
 Medication Instructions:  Your physician recommends that you continue on your current medications as directed. Please refer to the Current Medication list given to you today.  *If you need a refill on your cardiac medications before your next appointment, please call your pharmacy*  Lab Work: No labs ordered today  If you have labs (blood work) drawn today and your tests are completely normal, you will receive your results only by: MyChart Message (if you have MyChart) OR A paper copy in the mail If you have any lab test that is abnormal or we need to change your treatment, we will call you to review the results.  Testing/Procedures:   Implantable Loop Recorder Placement, Care After This sheet gives you information about how to care for yourself after your procedure. Your health care provider may also give you more specific instructions. If you have problems or questions, contact your health care provider. What can I expect after the procedure? After the procedure, it is common to have: Soreness or discomfort near the incision. Some swelling or bruising near the incision.  Follow these instructions at home: Incision care  Monitor your cardiac device site for redness, swelling, and drainage. Call the device clinic at [phone] if you experience these symptoms or fever/chills.  Keep the large square bandage on your site for 24 hours and then you may remove it yourself. Keep the steri-strips underneath in place.   You may shower after 72 hours / 3 days from your procedure with the steri-strips in place. They will usually fall off on their own, or may be removed after 10 days. Pat dry.   Avoid lotions, ointments, or perfumes over your incision until it is well-healed.  Please do not submerge in water until your site is completely healed.   Your device is MRI compatible.   Remote monitoring is used to monitor your cardiac device from home. This monitoring is scheduled every month by  our office. It allows us  to keep an eye on the function of your device to ensure it is working properly.  If your wound site starts to bleed apply pressure.    For help with the monitor please call Medtronic Monitor Support Specialist directly at [phone].    If you have any questions/concerns please call the device clinic at [phone].  Activity  Return to your normal activities.  General instructions Follow instructions from your health care provider about how to manage your implantable loop recorder and transmit the information. Learn how to activate a recording if this is necessary for your type of device. You may go through a metal detection gate, and you may let someone hold a metal detector over your chest. Show your ID card if needed. Do not have an MRI unless you check with your health care provider first. Take over-the-counter and prescription medicines only as told by your health care provider. Keep all follow-up visits as told by your health care provider. This is important. Contact a health care provider if: You have redness, swelling, or pain around your incision. You have a fever. You have pain that is not relieved by your pain medicine. You have triggered your device because of fainting (syncope) or because of a heartbeat that feels like it is racing, slow, fluttering, or skipping (palpitations). Get help right away if you have: Chest pain. Difficulty breathing. Summary After the procedure, it is common to have soreness or discomfort near the incision. Change your dressing as told by your health care provider. Follow instructions  from your health care provider about how to manage your implantable loop recorder and transmit the information. Keep all follow-up visits as told by your health care provider. This is important. This information is not intended to replace advice given to you by your health care provider. Make sure you discuss any questions you have with  your health care provider. Document Released: 05/01/2015 Document Revised: 07/05/2017 Document Reviewed: 07/05/2017 Elsevier Patient Education  2020 [employer].  .  Follow-Up: At [facility], you and your health needs are our priority.  As part of our continuing mission to provide you with exceptional heart care, our providers are all part of one team.  This team includes your primary Cardiologist (physician) and Advanced Practice Providers or APPs (Physician Assistants and Nurse Practitioners) who all work together to provide you with the care you need, when you need it.  Your next appointment:  [facility] office will call for placement of Medtronic Loop Recorder; Pre-Certification has been sent for approval by insurance.    Provider:    Fonda Kitty, MD   We recommend signing up for the patient portal called MyChart.  Sign up information is provided on this After Visit Summary.  MyChart is used to connect with patients for Virtual Visits (Telemedicine).  Patients are able to view lab/test results, encounter notes, upcoming appointments, etc.  Non-urgent messages can be sent to your provider as well.   To learn more about what you can do with MyChart, go to [email or web address].

## 2024-03-25 ENCOUNTER — Telehealth: Payer: Self-pay | Admitting: Cardiology

## 2024-03-25 NOTE — Telephone Encounter (Signed)
 Patient wants a call back regarding authorization for his procedure on 11/18 and next steps.

## 2024-03-26 ENCOUNTER — Other Ambulatory Visit: Payer: Self-pay

## 2024-03-30 ENCOUNTER — Telehealth: Payer: Self-pay | Admitting: Urology

## 2024-04-01 ENCOUNTER — Telehealth: Admitting: Urology

## 2024-04-19 NOTE — Progress Notes (Unsigned)
 Electrophysiology Office Note:   Date:  04/20/2024  ID:  Kristopher Christensen, DOB 06-Jan-1951, MRN [id number]  Primary Cardiologist: None Electrophysiologist: Fonda Kitty, MD      History of Present Illness:   Kristopher Christensen is a 73 y.o. adult with h/o CVA, HTN, T2DM who is being seen today for loop recorder implant.  He presented to ER 02/2024 with gait instability, slurred speech, L-sided weakness. MRI brain revealed acute infarct. TTE and TEE normal. He wore a 2 week monitor at discharge that did not show AFib.   Discussed the use of AI scribe software for clinical note transcription with the patient, who gave verbal consent to proceed.  History of Present Illness Kristopher Christensen is a 73 year old male who presents for evaluation of potential atrial fibrillation. She is accompanied by her wife, Kristopher Christensen. She was referred by a neurologist for monitoring of heart rhythm due to a history of strokes.  She has a history of a recent minor stroke, confirmed by imaging findings. During the evaluation, it was discovered that she had two previous minor strokes that were asymptomatic and unknown until the recent imaging.  She completed a wearable heart monitor, which did not show any obvious atrial fibrillation.  No heart fluttering, racing, or skipping sensations. She has not been aware of any irregular heart rhythms.    Review of systems complete and found to be negative unless listed in HPI.   EP Information / Studies Reviewed:    EKG is not ordered today. EKG from 03/15/24 reviewed which showed SR with RBBB     Zio 02/2024:   TEE 02/13/24:   1. Left ventricular ejection fraction, by estimation, is 60 to 65%. The  left ventricle has normal function.   2. Right ventricular systolic function is normal.   3. No left atrial/left atrial appendage thrombus was detected. The LAA  emptying velocity was 60 cm/s.   4. Agitated saline contrast bubble study was negative, with no evidence  of any  interatrial shunt.   Echo 02/11/24:  1. Left ventricular ejection fraction, by estimation, is 60 to 65%. The  left ventricle has normal function. The left ventricle has no regional  wall motion abnormalities. Left ventricular diastolic parameters are  consistent with Grade I diastolic  dysfunction (impaired relaxation).   2. Right ventricular systolic function is normal. The right ventricular  size is normal.   3. The mitral valve is normal in structure. Trivial mitral valve  regurgitation. No evidence of mitral stenosis.   4. The aortic valve is normal in structure. Aortic valve regurgitation is  not visualized. No aortic stenosis is present.   5. The inferior vena cava is normal in size with greater than 50%  respiratory variability, suggesting right atrial pressure of 3 mmHg.   Physical Exam:   VS:  BP 110/66 (BP Location: Right Arm, Patient Position: Sitting, Cuff Size: Normal)   Pulse (!) 58   Ht 5' 11 (1.803 m)   Wt 174 lb (78.9 kg)   SpO2 96%   BMI 24.27 kg/m    Wt Readings from Last 3 Encounters:  04/20/24 174 lb (78.9 kg)  03/15/24 180 lb 9.6 oz (81.9 kg)  02/13/24 179 lb 14.3 oz (81.6 kg)     General: Well developed, in no acute distress.  Neck: No JVD.  Cardiac: Normal rate, regular rhythm.  Resp: Normal work of breathing.  Ext: No edema.  Neuro: No gross focal deficits.  Psych: Normal affect.  ASSESSMENT AND PLAN:    #Cryptogenic stroke: Discussed role of loop recorder and monitoring for atrial fibrillation as possible etiology for stroke.  Patient voiced understanding.  Discussed risk and benefits of loop recorder implantation.  Discussed monthly charges for remote monitoring.  Patient voiced understanding and elected to proceed today.  #Hypertension -At goal today.  Recommend checking blood pressures 1-2 times per week at home and recording the values.  Recommend bringing these recordings to the primary care physician.  SURGEON:  Fonda Kitty, MD      PREPROCEDURE DIAGNOSIS:  Cryptogenic stroke    POSTPROCEDURE DIAGNOSIS: Cryptogenic stroke     PROCEDURES:   1. Implantable loop recorder implantation    INTRODUCTION:  Kristopher Christensen presents with a history of cryptogenic stroke The costs of loop recorder monitoring have been discussed with the patient.    DESCRIPTION OF PROCEDURE:  Informed written consent was obtained.   Time Out Completed with RN    The patient required no sedation for the procedure today.  Mapping over the patient's chest was performed to identify the area where electrograms were most prominent for ILR recording.  This area was found to be the left parasternal region over the 4th intercostal space. The patients left chest was therefore prepped and draped in the usual sterile fashion. The skin overlying the left parasternal region was infiltrated with lidocaine  for local analgesia.  A 0.5-cm incision was made over the left parasternal region over the 3rd intercostal space.  A subcutaneous ILR pocket was fashioned using a combination of sharp and blunt dissection.  A Medtronic Reveal LINQ 2 implantable loop recorder (serial # [id number]) was then placed into the pocket  R waves were very prominent and measuring 0.20mV.  Steri- Strips and a sterile dressing were then applied.  There were no early apparent complications.     CONCLUSIONS:   1. Successful implantation of a implantable loop recorder for a history of cryptogenic stroke  2. No early apparent complications.    Follow up with EP Team as needed.   Signed, Fonda Kitty, MD

## 2024-04-20 ENCOUNTER — Encounter: Payer: Self-pay | Admitting: Cardiology

## 2024-04-20 ENCOUNTER — Ambulatory Visit: Attending: Cardiology | Admitting: Cardiology

## 2024-04-20 VITALS — BP 110/66 | HR 58 | Ht 71.0 in | Wt 174.0 lb

## 2024-04-20 DIAGNOSIS — I10 Essential (primary) hypertension: Secondary | ICD-10-CM | POA: Diagnosis not present

## 2024-04-20 DIAGNOSIS — I639 Cerebral infarction, unspecified: Secondary | ICD-10-CM

## 2024-04-20 NOTE — Patient Instructions (Addendum)
 Medication Instructions:  Your physician recommends that you continue on your current medications as directed. Please refer to the Current Medication list given to you today.  Labwork: None ordered.  Testing/Procedures: None ordered.  Follow-Up:  As needed with Dr. Jimmey Ralph  Implantable Loop Recorder Placement, Care After This sheet gives you information about how to care for yourself after your procedure. Your health care provider may also give you more specific instructions. If you have problems or questions, contact your health care provider. What can I expect after the procedure? After the procedure, it is common to have: Soreness or discomfort near the incision. Some swelling or bruising near the incision.  Follow these instructions at home: Incision care  Monitor your cardiac device site for redness, swelling, and drainage. Call the device clinic at [phone] if you experience these symptoms or fever/chills.  Keep the large square bandage on your site for 24 hours and then you may remove it yourself. Keep the steri-strips underneath in place.   You may shower after 72 hours / 3 days from your procedure with the steri-strips in place. They will usually fall off on their own, or may be removed after 10 days. Pat dry.   Avoid lotions, ointments, or perfumes over your incision until it is well-healed.  Please do not submerge in water until your site is completely healed.   Your device is MRI compatible.   Remote monitoring is used to monitor your cardiac device from home. This monitoring is scheduled every month by our office. It allows [place] to keep an eye on the function of your device to ensure it is working properly.  If your wound site starts to bleed apply pressure.    For help with the monitor please call Med[employer] Monitor Support Specialist directly at [phone].    If you have any questions/concerns please call the device clinic at  [phone].  Activity  Return to your normal activities.  General instructions Follow instructions from your health care provider about how to manage your implantable loop recorder and transmit the information. Learn how to activate a recording if this is necessary for your type of device. You may go through a metal detection gate, and you may let someone hold a metal detector over your chest. Show your ID card if needed. Do not have an MRI unless you check with your health care provider first. Take over-the-counter and prescription medicines only as told by your health care provider. Keep all follow-up visits as told by your health care provider. This is important. Contact a health care provider if: You have redness, swelling, or pain around your incision. You have a fever. You have pain that is not relieved by your pain medicine. You have triggered your device because of fainting (syncope) or because of a heartbeat that feels like it is racing, slow, fluttering, or skipping (palpitations). Get help right away if you have: Chest pain. Difficulty breathing. Summary After the procedure, it is common to have soreness or discomfort near the incision. Change your dressing as told by your health care provider. Follow instructions from your health care provider about how to manage your implantable loop recorder and transmit the information. Keep all follow-up visits as told by your health care provider. This is important. This information is not intended to replace advice given to you by your health care provider. Make sure you discuss any questions you have with your health care provider. Document Released: 05/01/2015 Document Revised: 07/05/2017 Document Reviewed: 07/05/2017 Elsevier Patient  Education  [employer].

## 2024-05-21 ENCOUNTER — Ambulatory Visit: Payer: Self-pay | Admitting: Cardiology

## 2024-05-21 ENCOUNTER — Ambulatory Visit

## 2024-05-21 DIAGNOSIS — I639 Cerebral infarction, unspecified: Secondary | ICD-10-CM | POA: Diagnosis not present

## 2024-05-21 LAB — CUP PACEART REMOTE DEVICE CHECK
Date Time Interrogation Session: 20251219085233
Implantable Pulse Generator Implant Date: 20251118

## 2024-05-24 NOTE — Progress Notes (Signed)
 Remote Loop Recorder Transmission

## 2024-06-21 ENCOUNTER — Ambulatory Visit: Attending: Cardiology

## 2024-06-21 DIAGNOSIS — I639 Cerebral infarction, unspecified: Secondary | ICD-10-CM

## 2024-06-22 LAB — CUP PACEART REMOTE DEVICE CHECK
Date Time Interrogation Session: 20260119085117
Implantable Pulse Generator Implant Date: 20251118

## 2024-06-25 NOTE — Progress Notes (Signed)
 Remote Loop Recorder Transmission

## 2024-06-27 ENCOUNTER — Ambulatory Visit: Payer: Self-pay | Admitting: Cardiology

## 2024-07-22 ENCOUNTER — Ambulatory Visit

## 2024-08-22 ENCOUNTER — Ambulatory Visit

## 2024-09-22 ENCOUNTER — Ambulatory Visit
